# Patient Record
Sex: Female | Born: 1960 | Race: White | Hispanic: No | Smoking: Former smoker
Health system: Southern US, Community
[De-identification: ages and names within clinical notes are randomized; demographics above are authoritative.]

## PROBLEM LIST (undated history)

## (undated) DIAGNOSIS — L814 Other melanin hyperpigmentation: Secondary | ICD-10-CM

## (undated) DIAGNOSIS — I10 Essential (primary) hypertension: Secondary | ICD-10-CM

## (undated) DIAGNOSIS — K648 Other hemorrhoids: Secondary | ICD-10-CM

## (undated) DIAGNOSIS — N200 Calculus of kidney: Secondary | ICD-10-CM

## (undated) HISTORY — DX: Other melanin hyperpigmentation: L81.4

## (undated) HISTORY — DX: Calculus of kidney: N20.0

## (undated) HISTORY — DX: Essential (primary) hypertension: I10

## (undated) HISTORY — DX: Other hemorrhoids: K64.8

---

## 1997-10-11 ENCOUNTER — Other Ambulatory Visit: Admission: RE | Admit: 1997-10-11 | Discharge: 1997-10-11 | Payer: Self-pay | Admitting: Gynecology

## 2000-12-15 ENCOUNTER — Other Ambulatory Visit: Admission: RE | Admit: 2000-12-15 | Discharge: 2000-12-15 | Payer: Self-pay | Admitting: Gynecology

## 2002-08-12 ENCOUNTER — Other Ambulatory Visit: Admission: RE | Admit: 2002-08-12 | Discharge: 2002-08-12 | Payer: Self-pay | Admitting: Obstetrics and Gynecology

## 2002-08-17 ENCOUNTER — Encounter: Payer: Self-pay | Admitting: Obstetrics and Gynecology

## 2002-08-17 ENCOUNTER — Ambulatory Visit (HOSPITAL_COMMUNITY): Admission: RE | Admit: 2002-08-17 | Discharge: 2002-08-17 | Payer: Self-pay | Admitting: Obstetrics and Gynecology

## 2002-09-27 ENCOUNTER — Encounter: Payer: Self-pay | Admitting: Obstetrics and Gynecology

## 2002-09-27 ENCOUNTER — Encounter (INDEPENDENT_AMBULATORY_CARE_PROVIDER_SITE_OTHER): Payer: Self-pay | Admitting: Specialist

## 2002-09-27 ENCOUNTER — Ambulatory Visit (HOSPITAL_COMMUNITY): Admission: RE | Admit: 2002-09-27 | Discharge: 2002-09-27 | Payer: Self-pay | Admitting: Obstetrics and Gynecology

## 2003-10-05 ENCOUNTER — Other Ambulatory Visit: Admission: RE | Admit: 2003-10-05 | Discharge: 2003-10-05 | Payer: Self-pay | Admitting: Obstetrics and Gynecology

## 2004-02-12 HISTORY — PX: RIGHT OOPHORECTOMY: SHX2359

## 2004-10-23 ENCOUNTER — Other Ambulatory Visit: Admission: RE | Admit: 2004-10-23 | Discharge: 2004-10-23 | Payer: Self-pay | Admitting: Obstetrics and Gynecology

## 2005-06-25 ENCOUNTER — Emergency Department (HOSPITAL_COMMUNITY): Admission: EM | Admit: 2005-06-25 | Discharge: 2005-06-25 | Payer: Self-pay | Admitting: Emergency Medicine

## 2005-11-28 ENCOUNTER — Other Ambulatory Visit: Admission: RE | Admit: 2005-11-28 | Discharge: 2005-11-28 | Payer: Self-pay | Admitting: Obstetrics and Gynecology

## 2006-10-04 IMAGING — CT CT PELVIS W/O CM
1 series · 14 of 32 positions shown, 18 images · IV contrast (agent unspecified)
Comparison: None.

CLINICAL DATA: Right lower quadrant and flank pain for three days.  Question kidney stone.  
ABDOMEN CT WITHOUT CONTRAST:
TECHNIQUE: Multidetector CT imaging of the abdomen was performed following the standard protocol without IV contrast.
TECHNIQUE: Multidetector CT imaging of the pelvis was performed following the standard protocol without IV contrast.

[Series 2: kidney sto 5.0 b30f · axial · 0.63mm/px · z∈[-400,-56]mm · 14 of 97 slices shown, 18 images]
[im 7/97  soft-tissue]
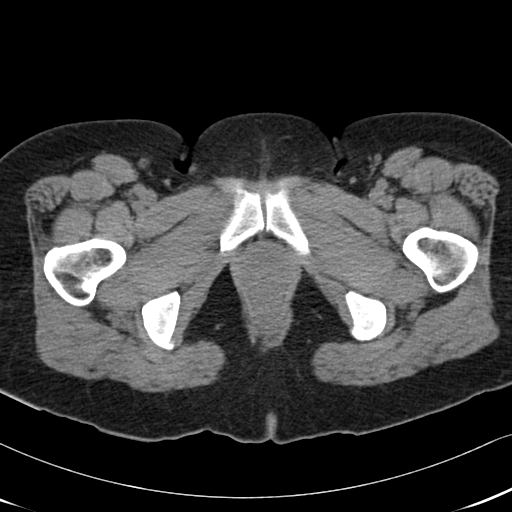
[im 7/97  bone]
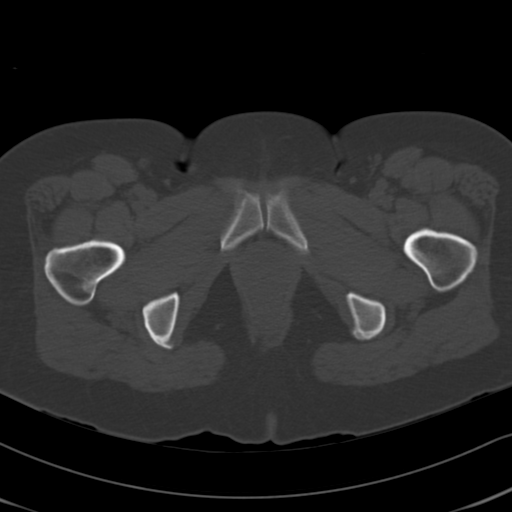
[im 13/97  soft-tissue]
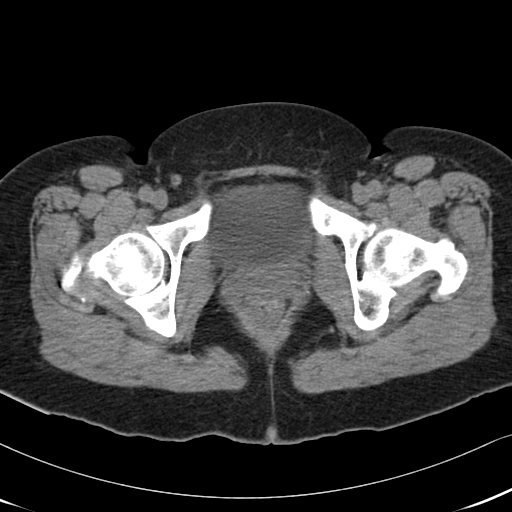
[im 22/97  soft-tissue]
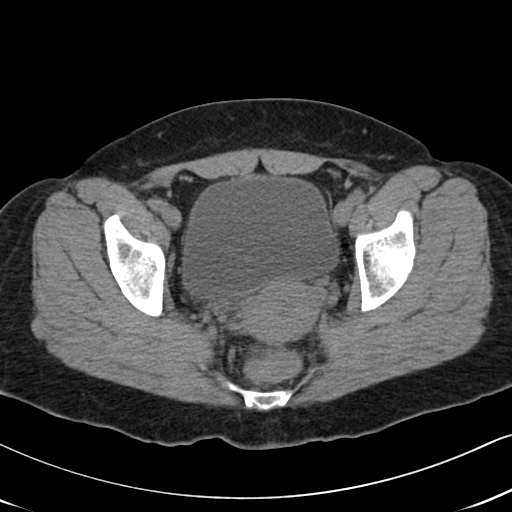
[im 28/97  soft-tissue]
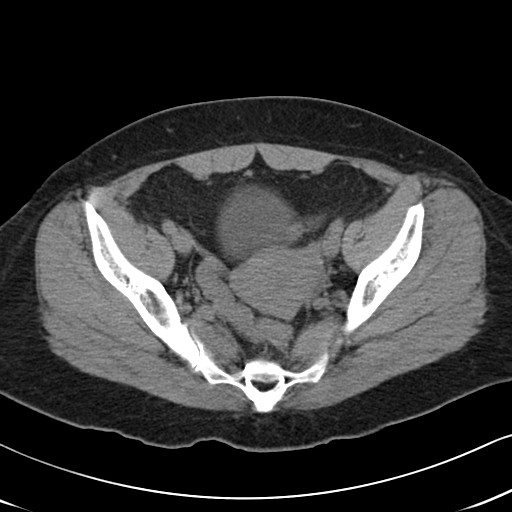
[im 38/97  soft-tissue]
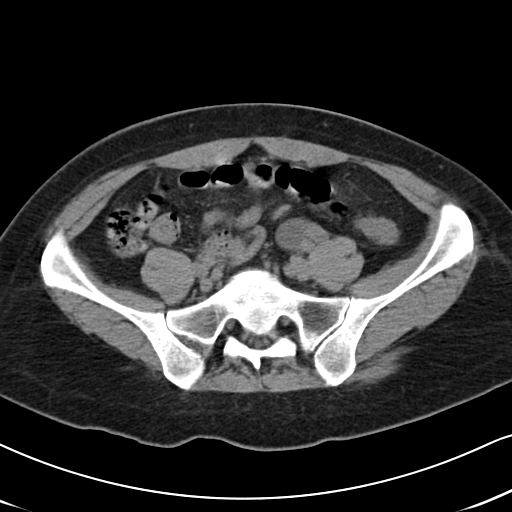
[im 44/97  soft-tissue]
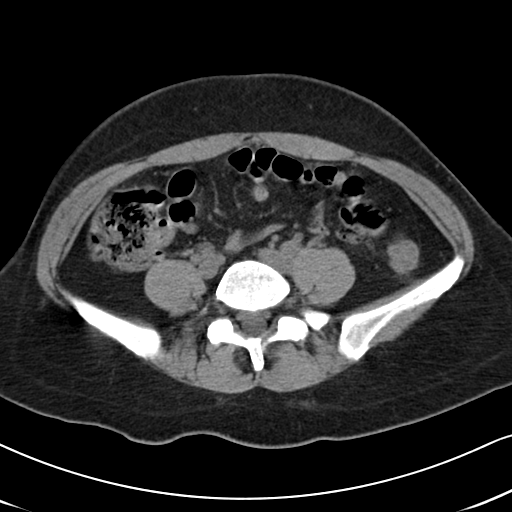
[im 53/97  soft-tissue]
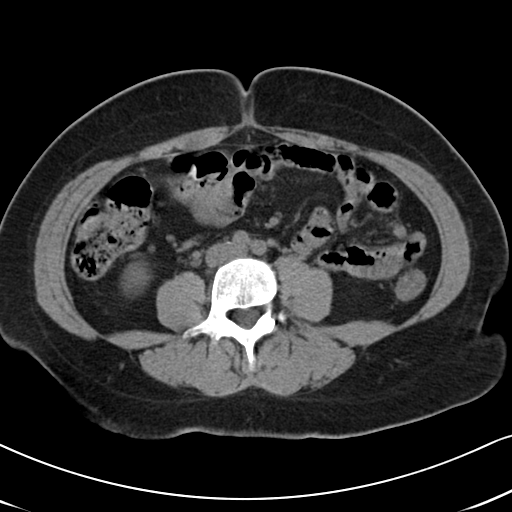
[im 59/97  soft-tissue]
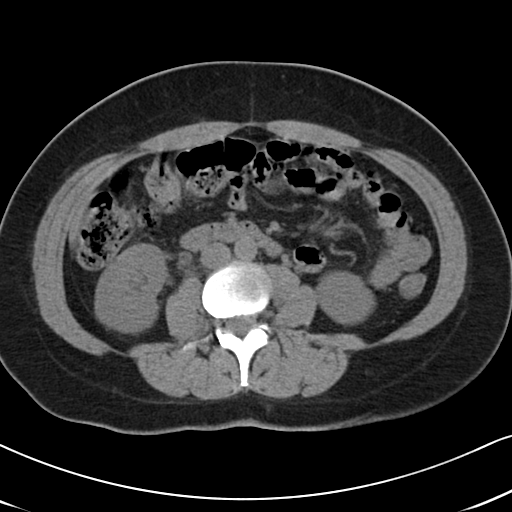
[im 69/97  soft-tissue]
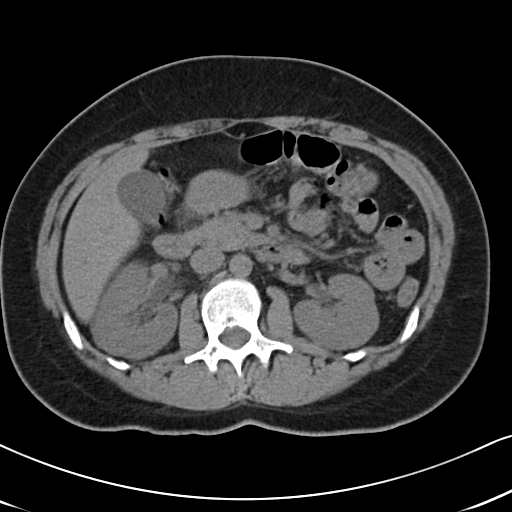
[im 69/97  bone]
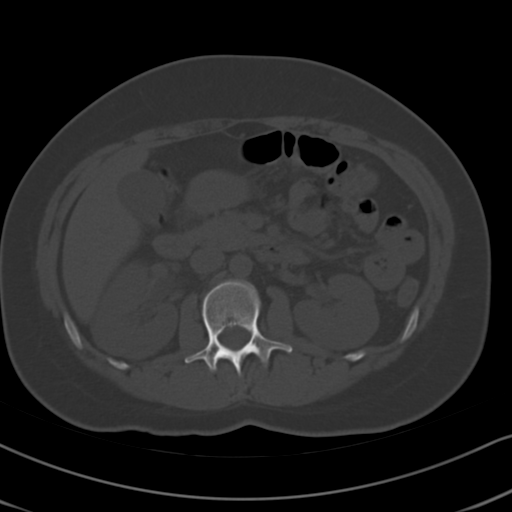
[im 75/97  soft-tissue]
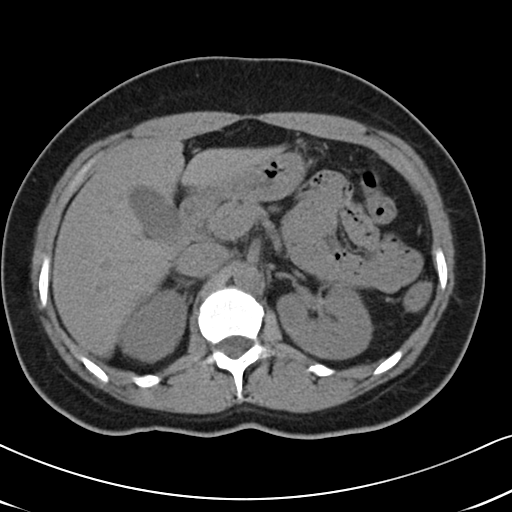
[im 84/97  soft-tissue]
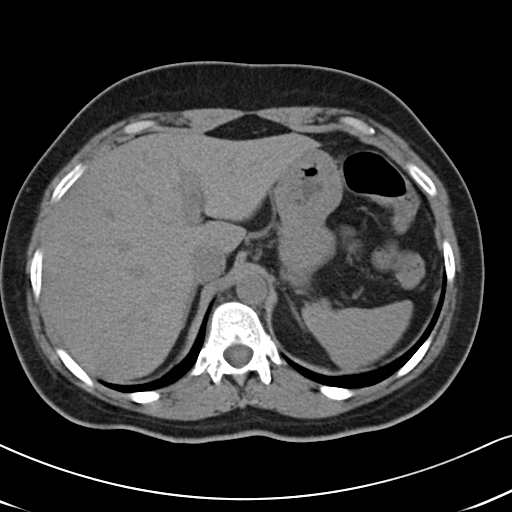
[im 84/97  lung]
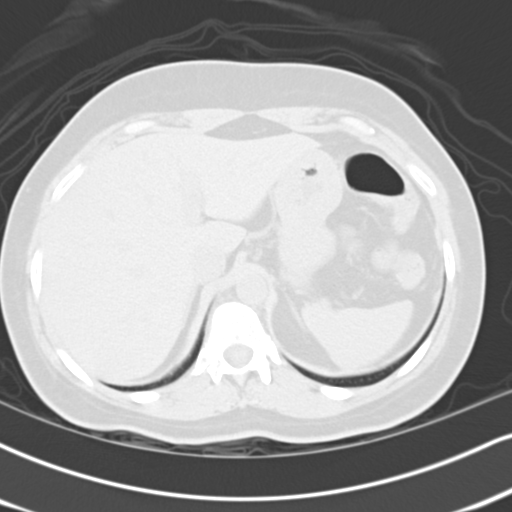
[im 87/97  lung]
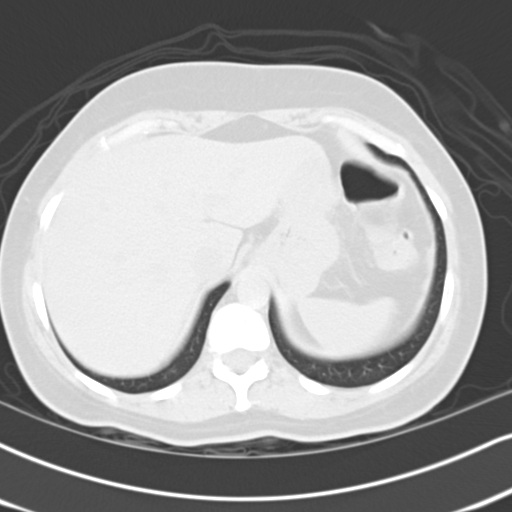
[im 90/97  soft-tissue]
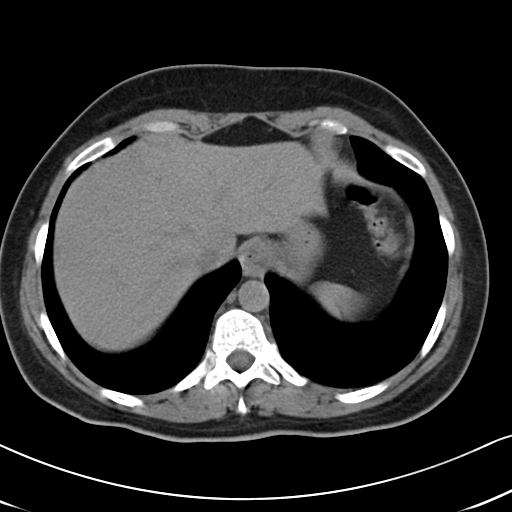
[im 90/97  lung]
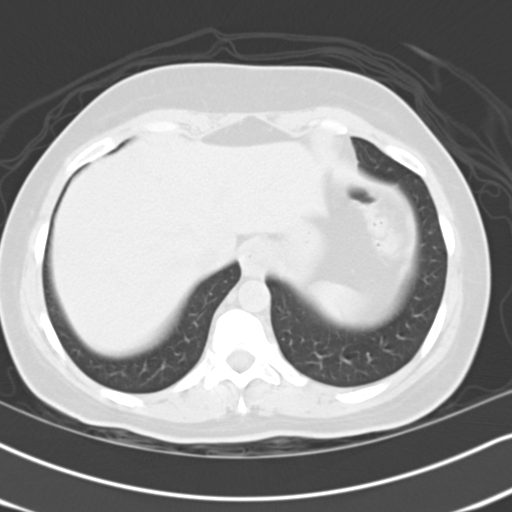
[im 93/97  lung]
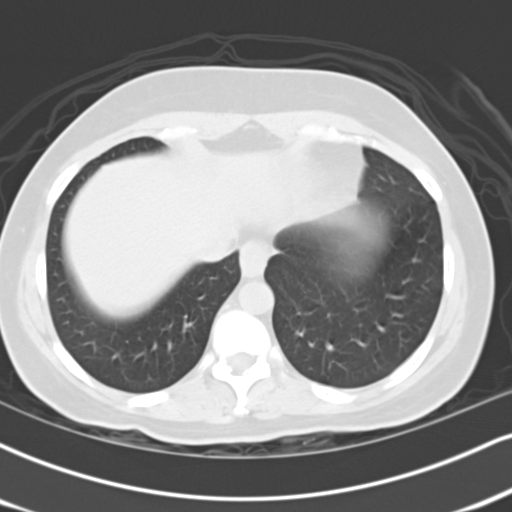

[14 of 32 positions shown; findings below may reference images not displayed]

FINDINGS: The lung bases are clear.  The heart size is normal.  There is no pericardial or pleural effusion.  Too small to characterize left hepatic dome lesion on image 6, which likely represents a tiny cyst.  
The uninfused appearance of the spleen is normal.  The distal esophagus is borderline thick walled, suggestive of mild esophagitis. 
The stomach, pancreas, gallbladder, and adrenal glands are normal. 
The left kidney is normal without evidence of calculus or hydronephrosis.
There are no right renal calculi.  There are mild proximal signs of right perirenal edema and hydroureter.  These are secondary to probable calculus, to be described below.  There is no retroperitoneal or retrocrural adenopathy.  The appendix is seen on image 63 and is unremarkable.  The colon is otherwise normal.  Small bowel is normal, and there is no ascites.
IMPRESSION: 1.  Mild right-sided secondary signs of right perirenal edema and hydroureter due to a probable stone described below. 
2.  Possible distal esophageal wall thickening.  Correlate with any symptoms of esophagitis. 
PELVIS CT WITHOUT CONTRAST:
FINDINGS: Pelvic large and small bowel is normal.  The right ureter is prominent throughout its course.  It is difficult to follow distally.  There is a punctate calcific density suspicious for distal ureteric calculus on image 74.  This measures approximately 1 mm.  More inferiorly, there are calcifications on images 78 and 79 within the bilateral hemipelvis.  These are felt to be phlebolithic in nature.  There are no urinary bladder calculi.  A sub millimetric calcific density on image 93 in the region of the urethra is of questionable clinical significance. 
The left ovary is within the upper limits of normal for size.  The uterus and right adnexa are normal.  
Bone windows demonstrate no worrisome osseous lesions.
IMPRESSION: 1.  Mild right-sided hydroureter, likely secondary to a punctate distal ureteric calculus.  The distal ureter is difficult to follow, and this calcification could less likely be vascular in nature. 
2.  Calcific density in the region of the urethra measures less than 1 mm and is of questionable clinical significance.

## 2007-03-02 ENCOUNTER — Encounter: Admission: RE | Admit: 2007-03-02 | Discharge: 2007-03-02 | Payer: Self-pay | Admitting: Obstetrics and Gynecology

## 2008-10-24 ENCOUNTER — Encounter: Admission: RE | Admit: 2008-10-24 | Discharge: 2008-10-24 | Payer: Self-pay | Admitting: Obstetrics and Gynecology

## 2009-05-01 ENCOUNTER — Ambulatory Visit: Payer: Self-pay | Admitting: Family Medicine

## 2009-12-12 ENCOUNTER — Encounter: Admission: RE | Admit: 2009-12-12 | Discharge: 2009-12-12 | Payer: Self-pay | Admitting: Obstetrics and Gynecology

## 2010-06-29 NOTE — Op Note (Signed)
NAMEAYME, SHORT NO.:  [id number]   MEDICAL RECORD NO.:  [id number]                   PATIENT TYPE:  AMB   LOCATION:  DAY                                  FACILITY:  [facility]   PHYSICIAN:  Huel Cote, M.D.              DATE OF BIRTH:  1960-09-18   DATE OF PROCEDURE:  09/27/2002  DATE OF DISCHARGE:                                 OPERATIVE REPORT   PREOPERATIVE DIAGNOSES:  1. Complex right ovarian cyst.  2. Elevated CA125.  3. History of fertility treatments and ovarian stimulation.   POSTOPERATIVE DIAGNOSES:  1. Complex right ovarian cyst, with frozen pathology revealing a benign     corpus luteum hemorrhagic cyst with a thickened septum.  2. Elevated CA125.  3. History of fertility treatments and ovarian stimulation.   PROCEDURE:  Laparoscopic right salpingo-oophorectomy.   SURGEON:  Huel Cote, M.D.   ASSISTANT:  Malachi Pro. Ambrose Mantle, M.D.   ANESTHESIA:  General.   ESTIMATED BLOOD LOSS:  Minimal.   FLUIDS REPLACED:  1200 mL LR.   URINE OUTPUT:  150 mL clear urine.   FINDINGS:  There was a left ovary and tube, which were noted to be within  normal limits.  The right ovary was slightly enlarged, however had no  excrescences or abnormalities.  Frozen pathology was benign.  All other  anatomy in the pelvis and abdomen was normal.   DESCRIPTION OF PROCEDURE:  The patient was taken to the operating room,  where general anesthesia was obtained without difficulty.  She was then  prepped and draped in the normal sterile fashion in the dorsal lithotomy  position.  A speculum was placed within the vagina, the cervix identified  and grasped with a single-tooth tenaculum and a Hulka tenaculum placed  within the cervical os for uterine manipulation.  The speculum was then  removed, Foley catheter placed, and attention was then turned to the  patient's umbilicus, which was grasped with Allis clamps and elevated  inferiorly.  A small 2  cm incision was then made with a scalpel and carried  down to the fascia with the Allis used to elevate the anterior abdominal  wall.  The fascia was then opened sharply and the fascia incision extended  with Mayo scissors laterally.  Vicryl 0 was then placed in the fascial edge  and the peritoneal cavity was palpated digitally.  This was then entered  bluntly by digital probe.  With the peritoneal cavity entered, the fascia  was completely pursestringed with 0 Vicryl and the Hasson placed within the  peritoneal cavity.  The Hasson was secured with a suture and the camera  introduced with the pelvis and abdomen inspected with findings as previously  stated.  The inferior quadrants were then inspected and two additional 5 mm  disposable trocars were placed under direct visualization.  All trocar sites  were injected with 0.25% Marcaine prior to incision.  The ovary and tube  were then grasped with an atraumatic grasper and peeled away from the  uterine fundus.  The utero-ovarian ligament was then taken down with the  bipolar cutting cautery unit and then the infundibulopelvic ligament was  taken down in a similar fashion with the bipolar cutting cautery unit.  With  the ovary completely amputated and the tube with it, this was held in an  atraumatic grasper.  No active bleeding on any pedicles was noted.  Therefore, the Endo-Bag was introduced into the 12 mm port and the ovary  placed within it and removed and sent for frozen pathology.  One small area  of colon on the right which was close to the IP pedicle was pushed away with  Kitner device and found to be well away from the pedicle cautery area.  Once  again the abdomen and pelvis were carefully inspected with no active  bleeding noted and attention was then returned to the outer quadrants, where  each 5 mm disposable trocar was removed.  The Hasson was removed and the  pneumoperitoneum evacuated.  The fascial incision was then cinched  down from  its original pursestring suture and no defect was palpable.  One additional  figure-of-eight suture of 0 Vicryl was placed with a good closure noted.  The skin was then closed with 3-0 Vicryl in a subcuticular stitch.  Sponge,  lap, and needle counts were correct x2.  The Hulka tenaculum and the Foley  were removed, and the patient was awakened and taken to the recovery room  extubated and in stable condition.                                               Huel Cote, M.D.    KR/MEDQ  D:  09/27/2002  T:  09/27/2002  Job:  [id number]

## 2010-06-29 NOTE — H&P (Signed)
NAMEJANAYSIA, Alexis Lane NO.:  [id number]   MEDICAL RECORD NO.:  [id number]                   PATIENT TYPE:  AMB   LOCATION:  DAY                                  FACILITY:  [facility]   PHYSICIAN:  Huel Cote, M.D.              DATE OF BIRTH:  Dec 28, 1960   DATE OF ADMISSION:  DATE OF DISCHARGE:                                HISTORY & PHYSICAL   HISTORY OF PRESENT ILLNESS:  The patient is a 50 year old G1, P0, who is  admitted for a laparoscopic right salpingo-oophorectomy, possible left  salpingo-oophorectomy, given a finding of a borderline elevated CA-125 level  and an ultrasound demonstrating a complex right ovarian cyst with a mural  nodularity and septation.  The patient had a CA-125 level performed by her  primary M.D., which was 41.9.  A repeat of this level in July of 2004  decreased to 24.5, which was reassuring.  The patient had a history of  taking many fertility drugs in an effort to conceive, and for that reason,  is more concerned regarding possible ovarian cancer.  She had an ultrasound  performed on August 17, 2002, which revealed a complex 2 cm right ovarian cyst  with a mural nodularity and septation and desires definitive surgical  diagnosis and removal of this ovary.   PAST MEDICAL HISTORY:  1. Polycystic ovary disease.  2. Chronic hypertension.   PAST SURGICAL HISTORY:  In 1994, she had an ovarian wedge resection  laparoscopically.   MEDICATIONS:  1. Monopril.  2. Hydrochlorothiazide one p.o. daily.  3. Mega-3 fatty acids.   PAST GYNECOLOGICAL HISTORY:  The patient had a history of infertility and a  trial of in vitro fertilization which was not successful.   ALLERGIES:  None.   CURRENT MENSTRUAL HISTORY:  The patient had irregular cycles until reaching  approximately 50 years old and then began to have menses q.35d.   FAMILY HISTORY:  Significant for breast cancer in paternal grandmother and  in several great aunts,  all in their 65s.  Colon cancer:  None.  Her mother  had an MI at the age of 55.  She had a mammogram performed in April of 2004  which was within normal limits.  Cholesterol was normal.   PAST OBSTETRICAL HISTORY:  Significant for one spontaneous abortion in 1993.   PHYSICAL EXAMINATION:  VITAL SIGNS:  Weight is 146 pounds, blood pressure  112/76.  CARDIAC:  Regular rate and rhythm.  LUNGS:  Clear.  ABDOMEN:  Soft and nontender.  BREASTS:  Breasts have no masses, discharge, or adenopathy noted.  GENITALIA:  Her external genitalia are normal.  Speculum exam shows no  lesions on the cervix.  Uterus is normal in size and adnexa had no masses.  RECTAL:  Exam is Heme-negative.   The patient was counseled as to the risks and benefits of the procedure  including bleeding, infection, and possible  damage to bowel and bladder.  The patient understands these risks and desires to proceed with surgery as  stated.  She understands this will be an open laparoscopic procedure;  however, should any unusual bleeding or bowel or bladder injury occur, she  would need a larger incision to correct this problem at the time.  A frozen  specimen will be sent for diagnosis intraoperatively; however, the patient  wishes to be awakened and consulted before any further surgical therapy was  planned, should any carcinoma be present in the ovary.                                                    Huel Cote, M.D.    KR/MEDQ  D:  09/23/2002  T:  09/23/2002  Job:  [id number]

## 2010-08-01 ENCOUNTER — Inpatient Hospital Stay (INDEPENDENT_AMBULATORY_CARE_PROVIDER_SITE_OTHER)
Admission: RE | Admit: 2010-08-01 | Discharge: 2010-08-01 | Disposition: A | Discharge status: Home / Self Care | Payer: 59 | Source: Ambulatory Visit | Attending: Emergency Medicine | Admitting: Emergency Medicine

## 2010-08-01 ENCOUNTER — Ambulatory Visit
Admission: RE | Admit: 2010-08-01 | Discharge: 2010-08-01 | Disposition: A | Discharge status: Home / Self Care | Payer: 59 | Source: Ambulatory Visit | Attending: Emergency Medicine | Admitting: Emergency Medicine

## 2010-08-01 ENCOUNTER — Other Ambulatory Visit: Payer: Self-pay | Admitting: Emergency Medicine

## 2010-08-01 ENCOUNTER — Encounter: Payer: Self-pay | Admitting: Emergency Medicine

## 2010-08-01 DIAGNOSIS — M542 Cervicalgia: Secondary | ICD-10-CM

## 2010-08-01 DIAGNOSIS — I10 Essential (primary) hypertension: Secondary | ICD-10-CM | POA: Insufficient documentation

## 2010-08-05 ENCOUNTER — Telehealth (INDEPENDENT_AMBULATORY_CARE_PROVIDER_SITE_OTHER): Payer: Self-pay | Admitting: Emergency Medicine

## 2010-08-20 ENCOUNTER — Encounter: Payer: Self-pay | Admitting: Internal Medicine

## 2010-08-27 ENCOUNTER — Other Ambulatory Visit: Payer: Self-pay | Admitting: Family Medicine

## 2010-08-27 DIAGNOSIS — R102 Pelvic and perineal pain: Secondary | ICD-10-CM

## 2010-08-29 ENCOUNTER — Ambulatory Visit
Admission: RE | Admit: 2010-08-29 | Discharge: 2010-08-29 | Disposition: A | Discharge status: Home / Self Care | Payer: 59 | Source: Ambulatory Visit | Attending: Family Medicine | Admitting: Family Medicine

## 2010-08-29 DIAGNOSIS — R102 Pelvic and perineal pain: Secondary | ICD-10-CM

## 2010-09-07 ENCOUNTER — Ambulatory Visit (AMBULATORY_SURGERY_CENTER): Payer: Commercial Managed Care - PPO | Admitting: *Deleted

## 2010-09-07 VITALS — Ht 61.0 in | Wt 136.3 lb

## 2010-09-07 DIAGNOSIS — Z1211 Encounter for screening for malignant neoplasm of colon: Secondary | ICD-10-CM

## 2010-09-07 MED ORDER — PEG-KCL-NACL-NASULF-NA ASC-C 100 G PO SOLR: ORAL | Status: DC

## 2010-09-21 ENCOUNTER — Encounter: Payer: Self-pay | Admitting: Internal Medicine

## 2010-09-21 ENCOUNTER — Ambulatory Visit (AMBULATORY_SURGERY_CENTER): Payer: Commercial Managed Care - PPO | Admitting: Internal Medicine

## 2010-09-21 VITALS — BP 101/67 | HR 98 | Temp 98.1°F | Resp 17 | Ht 61.0 in | Wt 132.0 lb

## 2010-09-21 DIAGNOSIS — Z1211 Encounter for screening for malignant neoplasm of colon: Secondary | ICD-10-CM

## 2010-09-21 MED ORDER — SODIUM CHLORIDE 0.9 % IV SOLN: 500.0000 mL | INTRAVENOUS | Status: DC

## 2010-09-24 ENCOUNTER — Telehealth: Payer: Self-pay | Admitting: *Deleted

## 2010-09-24 NOTE — Telephone Encounter (Signed)
Message left to call us if need be.

## 2010-11-01 ENCOUNTER — Encounter: Payer: Self-pay | Admitting: Family Medicine

## 2010-11-01 ENCOUNTER — Inpatient Hospital Stay (INDEPENDENT_AMBULATORY_CARE_PROVIDER_SITE_OTHER)
Admission: RE | Admit: 2010-11-01 | Discharge: 2010-11-01 | Disposition: A | Discharge status: Home / Self Care | Payer: 59 | Source: Ambulatory Visit | Attending: Family Medicine | Admitting: Family Medicine

## 2010-11-01 DIAGNOSIS — M542 Cervicalgia: Secondary | ICD-10-CM

## 2010-11-01 DIAGNOSIS — M7710 Lateral epicondylitis, unspecified elbow: Secondary | ICD-10-CM

## 2010-12-18 ENCOUNTER — Ambulatory Visit (INDEPENDENT_AMBULATORY_CARE_PROVIDER_SITE_OTHER): Payer: 59 | Admitting: Family Medicine

## 2010-12-18 ENCOUNTER — Encounter: Payer: Self-pay | Admitting: Family Medicine

## 2010-12-18 VITALS — BP 122/82 | HR 93 | Temp 98.4°F | Ht 61.0 in | Wt 130.0 lb

## 2010-12-18 DIAGNOSIS — M542 Cervicalgia: Secondary | ICD-10-CM

## 2010-12-18 MED ORDER — TRAMADOL HCL 50 MG PO TABS: 50.0000 mg | ORAL_TABLET | Freq: Three times a day (TID) | ORAL | Status: DC | PRN

## 2010-12-18 MED ORDER — MELOXICAM 15 MG PO TABS: 15.0000 mg | ORAL_TABLET | Freq: Every day | ORAL | Status: AC

## 2010-12-18 NOTE — Patient Instructions (Signed)
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 Meloxicam 15mg  daily with food for pain and inflammation x 7 days then as needed.  Tramadol as needed for severe pain (no driving on this medicine). Consider cervical collar if severely painful - I do not think you need this. Simple range of motion exercises within limits of pain to prevent further stiffness. Consider physical therapy for stretching, exercises, and modalities. Acupuncture, chiropractor, and massage are considerations as well but results are mixed with these. Heat 15 minutes at a time 3-4 times a day to help with spasms. Watch head position when on computers, texting, when sleeping in bed - neck should in line with back when sleeping. Call me in a few weeks if you want to go ahead with physical therapy and are not improving as expected with the above and acupuncture.

## 2010-12-18 NOTE — Progress Notes (Signed)
Subjective:    Patient ID: Alexis Lane, female    DOB: 05-06-1960, 50 y.o.   MRN: [id number]  PCP: Judge Stall  HPI 50 yo F here for neck pain.  Patient reports several months ago she recalls having woken up with pain in posterior neck. Went to ED and had radiographs done showing DDD at C5-6 and C6-7 levels. She was prescribed tramadol then which helped. She also went on to have acupuncture which also helped to resolve this over time. Very active with yoga and gets regular massages. Then approximately 2 weeks ago she developed similar pain in posterior neck more on left side that has persisted. Believes this is likely related to increased stress from work. Feels more with neck flexion and left lateral rotation. Has tried stretching, ibuprofen, heat and ice. No numbness or tingling. No bowel or bladder dysfunction. Some radiation into left > right arms but nothing consistent.  Past Medical History  Diagnosis Date  . Hypertension   . Kidney stones     Current Outpatient Prescriptions on File Prior to Visit  Medication Sig Dispense Refill  . Cholecalciferol (VITAMIN D) 1000 UNITS capsule Take 1,000 Units by mouth daily.        . Coenzyme Q10 (CO Q-10) 100 MG CAPS Take 1 tablet by mouth daily.        . Magnesium 500 MG CAPS Take 1 capsule by mouth daily.        . Multiple Vitamin (MULTIVITAMIN) tablet Take 1 tablet by mouth daily.        Marland Kitchen olmesartan-hydrochlorothiazide (BENICAR HCT) 20-12.5 MG per tablet Take 1 tablet by mouth at bedtime.        . Omega-3 Fatty Acids (FISH OIL) 1000 MG CAPS Take 1 capsule by mouth daily.        Marland Kitchen OVER THE COUNTER MEDICATION Apply 20 mg topically daily. PRO-GEST TOPICAL CREAM         Past Surgical History  Procedure Date  . Right oophorectomy 2006    No Known Allergies  History   Social History  . Marital Status: Divorced    Spouse Name: N/A    Number of Children: N/A  . Years of Education: N/A   Occupational History  . Not on  file.   Social History Main Topics  . Smoking status: Former Games developer  . Smokeless tobacco: Never Used  . Alcohol Use: No  . Drug Use: No  . Sexually Active: Not on file   Other Topics Concern  . Not on file   Social History Narrative  . No narrative on file    Family History  Problem Relation Age of Onset  . Lung cancer Mother 19  . Heart attack Mother   . Hypertension Mother   . Colon cancer Neg Hx   . Diabetes Neg Hx   . Hyperlipidemia Neg Hx   . Sudden death Neg Hx   . Heart attack Father   . Hypertension Maternal Aunt   . Hypertension Maternal Grandmother   . Heart attack Maternal Grandfather     BP 122/82  Pulse 93  Temp(Src) 98.4 F (36.9 C) (Oral)  Ht 5\' 1"  (1.549 m)  Wt 130 lb (58.968 kg)  BMI 24.56 kg/m2  Review of Systems See HPI above.    Objective:   Physical Exam Neck: No gross deformity, swelling, bruising. TTP left cervical paraspinal region.  No midline/bony TTP. FROM neck - pain on left lateral rotation and full flexion. BUE strength 5/5.  Sensation intact to light touch.  2+ equal reflexes in triceps, biceps, brachioradialis tendons. Negative spurlings. NV intact distal BUEs.    Assessment & Plan:  1. Neck pain - 2/2 DDD and cervical spasms - Discussed conservative care - she would like to wait on physical therapy and again try acupuncture as this has helped in the past.  Start meloxicam + tramadol as needed.  Did not tolerate muscle relaxant well in the past and she would like to avoid this.  Advised to call us if she would like to proceed with PT.  Do not think based on her exam and radiographs that she has radiculopathy.  See instructions for further.

## 2010-12-19 DIAGNOSIS — M542 Cervicalgia: Secondary | ICD-10-CM | POA: Insufficient documentation

## 2010-12-19 NOTE — Assessment & Plan Note (Signed)
2/2 DDD and cervical spasms - Discussed conservative care - she would like to wait on physical therapy and again try acupuncture as this has helped in the past.  Start meloxicam + tramadol as needed.  Did not tolerate muscle relaxant well in the past and she would like to avoid this.  Advised to call us if she would like to proceed with PT.  Do not think based on her exam and radiographs that she has radiculopathy.  See instructions for further.

## 2011-01-14 NOTE — Telephone Encounter (Signed)
  Phone Note Outgoing Call   Call placed by: Lavell Islam RN,  August 05, 2010 12:54 PM Call placed to: Patient Action Taken: Phone Call Completed Summary of Call: Left message on voice mail inquiring about patient's neck pain status; encouraged her to call us with any questions/concerns. Initial call taken by: Lavell Islam RN,  August 05, 2010 12:55 PM

## 2011-01-14 NOTE — Progress Notes (Signed)
Summary: neck pain (rm 5)   Vital Signs:  Patient Profile:   50 Years Old Female CC:      neck pain x 10 days Height:     61 inches Weight:      130 pounds O2 Sat:      100 % O2 treatment:    Room Air Temp:     99.1 degrees F oral Pulse rate:   101 / minute Resp:     16 per minute BP sitting:   152 / 87  (left arm) Cuff size:   regular  Pt. in pain?   yes    Location:   neck    Intensity:   8    Type:       dull  Vitals Entered By: Lajean Saver RN (August 01, 2010 12:26 PM)                   Updated Prior Medication List: BENICAR HCT 20-12.5 MG TABS (OLMESARTAN MEDOXOMIL-HCTZ) once daily  Current Allergies: No known allergies History of Present Illness History from: patient Chief Complaint: neck pain x 10 days History of Present Illness: Pt complains of neck pain. Location:posterior neck/cervical area diffusely Onset: 10 days Description/Quality of Pain:dull ache Intensity of pain: 6/10 at times. Sometimes 8/10. Otilio Carpen feels a "click" when she turns her head. She states that over the past 10 years, she may get this neck pain, which resolves after 1-2 weeks with conservative tx or going to chiropractor. She states that plain neck xrays approx 2006 (done elsewhere)  were negative. Modifying Factors: Tried Ibuprofen 800 three times a day for 1 wk, minimal help Trauma: None recalled. Symptoms Worse with: flex, extend, turn head Better with: rest Associated ssxs: pain radiates to post occipital area and to shoulders, but not beyond. Denies numbness, weakness, extremity sxs, chest pain ,dyspnea, or anterior neck pain. No ENT sxs or fever.   REVIEW OF SYSTEMS Constitutional Symptoms      Denies fever, chills, night sweats, weight loss, weight gain, and fatigue.  Eyes       Denies change in vision, eye pain, eye discharge, glasses, contact lenses, and eye surgery. Ear/Nose/Throat/Mouth       Denies hearing loss/aids, change in hearing, ear pain, ear discharge,  dizziness, frequent runny nose, frequent nose bleeds, sinus problems, sore throat, hoarseness, and tooth pain or bleeding.  Respiratory       Denies dry cough, productive cough, wheezing, shortness of breath, asthma, bronchitis, and emphysema/COPD.  Cardiovascular       Denies murmurs, chest pain, and tires easily with exhertion.    Gastrointestinal       Denies stomach pain, nausea/vomiting, diarrhea, constipation, blood in bowel movements, and indigestion. Genitourniary       Denies painful urination, kidney stones, and loss of urinary control. Neurological       Complains of headaches.      Denies paralysis, seizures, and fainting/blackouts. Musculoskeletal       Complains of muscle pain, joint pain, and decreased range of motion.      Denies joint stiffness, redness, swelling, muscle weakness, and gout.      Comments: neck Skin       Denies bruising, unusual mles/lumps or sores, and hair/skin or nail changes.  Psych       Denies mood changes, temper/anger issues, anxiety/stress, speech problems, depression, and sleep problems. Other Comments: Patient c/o neck pain x 10 days. Unknown cause. Pain is spreading into posterior head. She  has used ibuprofen and tylenol for pain relief. Denies any other symptoms   Past History:  Past Medical History: Hypertension  Past Surgical History: oophorectomy 2005  Family History: CVD- both parents  Social History: Never Smoked Alcohol use-no Drug use-no Smoking Status:  never Drug Use:  no Physical Exam General appearance: well developed, well nourished, no acute distress Head: normocephalic, atraumatic. Minimal bilat post-occipital ttp Eyes: conjunctivae and lids normal Pupils: equal, round, reactive to light Oral/Pharynx: tongue normal, posterior pharynx without erythema or exudate Neck: neck supple,  trachea midline, no masses. Carotids 2 + and = without bruits or tenderness. Thyroid: no nodules, masses, tenderness, or  enlargement Chest/Lungs: no rales, wheezes, or rhonchi bilateral, breath sounds equal without effort Heart: regular rate and  rhythm, no murmur Extremities: normal extremities Neurological: grossly intact and non-focal. Motor, sensory, DTR's 2 and = bilat upper and lower extremities Skin: no obvious rashes or lesions MSE: Post neck:No deformity.  Moderate diffuse ttp over entire post C-spine and paracervical mms bilaterally. Moderate spasm and ttp over bilat post paracervical mms. ROM to flexion, extension, lateral bending intact, but moderate pain and minimal crepitus upon these motions. Assessment New Problems: NECK PAIN, ACUTE (ICD-723.1) HYPERTENSION (ICD-401.9)  Most likely has musculo-ligamentous neck strain. No evidence of neurological deficit or impingement. XRAY C-Spine: No fx or acute bony abnomality."Straightening of cervical lordosis. Cervicothoracic junction alignment is within normal limits.  Normal prevertebral soft tissues.Mild to moderate C6-C7 disc space narrowing with endplate osteophytes.  Mild C5-C6 disc space narrowing and osteophytes. Mild osseous foraminal stenosis on the right at the C5 nerve level. Bilateral posterior element alignment is within normal limits.   C1,C2 alignment, AP alignment, odontoid, and lung apices within normal limits."  Patient Education: Patient and/or caregiver instructed in the following: Tylenol prn, Ibuprofen prn. Heat/ ice packs for sxs relief. Demonstrates willingness to comply.  Plan New Medications/Changes: TRAMADOL HCL 50 MG TABS (TRAMADOL HCL) 1 q 6-8 hrs as needed pain  #20 x 0, 08/01/2010, Lajean Manes MD  New Orders: T-DG Cervical Spine Complete 5 views [72050] New Patient Level III [99203] Planning Comments:   w/u and tx options discussed. I explained that I doubt her pain is discogenic. She declined any further imaging or referral. She prefers to use tramadol, which helped acute pain in the distant past. She pefers to  see her accupucturist for pain tx. Risks, benefits, alternatives discussed. Pt voiced understanding and agreement. --Advised to f/u if any worsening, new, or persistant sxs.  Follow Up: Follow up with Primary Physician Follow Up: PCP for HBP management.  The patient and/or caregiver has been counseled thoroughly with regard to medications prescribed including dosage, schedule, interactions, rationale for use, and possible side effects and they verbalize understanding.  Diagnoses and expected course of recovery discussed and will return if not improved as expected or if the condition worsens. Patient and/or caregiver verbalized understanding.  Prescriptions: TRAMADOL HCL 50 MG TABS (TRAMADOL HCL) 1 q 6-8 hrs as needed pain  #20 x 0   Entered and Authorized by:   Lajean Manes MD   Signed by:   Lajean Manes MD on 08/01/2010   Method used:   Handwritten   RxID:   [id number]   Orders Added: 1)  T-DG Cervical Spine Complete 5 views [72050] 2)  New Patient Level III [78469]  Appended Document: neck pain (rm 5) At time of visit, pt declined a cervical collar.

## 2011-01-14 NOTE — Progress Notes (Signed)
Summary: neck,arm pain...wse (rm 3)   Vital Signs:  Patient Profile:   50 Years Old Female CC:      worsening neck, elbow and arm pain, numbness Height:     61 inches Weight:      130 pounds O2 Sat:      99 % O2 treatment:    Room Air Temp:     98.5 degrees F oral Pulse rate:   80 / minute Resp:     16 per minute BP sitting:   115 / 82  (left arm) Cuff size:   regular  Pt. in pain?   yes    Location:   right elbow, arm    Type:       tingling  Vitals Entered By: Lajean Saver RN (November 01, 2010 12:35 PM)                   Updated Prior Medication List: BENICAR HCT 20-12.5 MG TABS (OLMESARTAN MEDOXOMIL-HCTZ) once daily TRAMADOL HCL 50 MG TABS (TRAMADOL HCL) 1 q 6-8 hrs as needed pain  Current Allergies: No known allergies History of Present Illness Chief Complaint: worsening neck, elbow and arm pain, numbness History of Present Illness:  Subjective:  Since her last visit, patient has developed increased vague tenderness in her right elbow, noticeable during full extension.  Pain radiates to forearm and is worse at night.  She has had vague intermittent numness in her forearm.  She states that her neck soreness has resolved, and has no pain in her upper back or shoulders.   She admits that on May 16 she spent a day on a [place] with her son, and her elbow became sore later.  REVIEW OF SYSTEMS Constitutional Symptoms      Denies fever, chills, night sweats, weight loss, weight gain, and fatigue.  Eyes       Denies change in vision, eye pain, eye discharge, glasses, contact lenses, and eye surgery. Ear/Nose/Throat/Mouth       Denies hearing loss/aids, change in hearing, ear pain, ear discharge, dizziness, frequent runny nose, frequent nose bleeds, sinus problems, sore throat, hoarseness, and tooth pain or bleeding.  Respiratory       Denies dry cough, productive cough, wheezing, shortness of breath, asthma, bronchitis, and emphysema/COPD.  Cardiovascular     Denies murmurs, chest pain, and tires easily with exhertion.    Gastrointestinal       Denies stomach pain, nausea/vomiting, diarrhea, constipation, blood in bowel movements, and indigestion. Genitourniary       Denies painful urination, blood or discharge from vagina, kidney stones, and loss of urinary control. Neurological       Complains of tingling.      Denies paralysis, seizures, and fainting/blackouts. Musculoskeletal       Complains of muscle pain, joint pain, and joint stiffness.      Denies decreased range of motion, redness, swelling, muscle weakness, and gout.  Skin       Denies bruising, unusual mles/lumps or sores, and hair/skin or nail changes.  Psych       Denies mood changes, temper/anger issues, anxiety/stress, speech problems, depression, and sleep problems. Other Comments: Patient was seen by Dr. Georgina Pillion 3 months ago for same symptoms. He told her to return for changes or worsening. Since then the pain and tingling has worsened, she also has developed pain in her right elbow. She is interested in a referral to a neurologist. She has taken Tramadol for as needed pain relief.  Past History:  Past Medical History: Reviewed history from 08/01/2010 and no changes required. Hypertension  Past Surgical History: Reviewed history from 08/01/2010 and no changes required. oophorectomy 2005  Family History: Reviewed history from 08/01/2010 and no changes required. CVD- both parents  Social History: Never Smoked Alcohol use-no Drug use-no Occupation: RN   Objective:  Appearance:  Patient appears healthy, stated age, and in no acute distress  Neck:  Full range of motion  Right shoulder: Full range of motion  Right elbow:  Full range of motion.  Distinct tenderness over the lateral epicondyle.  Palpation there during resisted dorsifexion and supination of her right wrist recreates her elbow pain.  Distal neurovascular intact and strength  intact. Assessment  Assessed NECK PAIN, ACUTE as improved - Donna Christen MD New Problems: LATERAL EPICONDYLITIS, RIGHT (ICD-726.32)   Plan New Medications/Changes: NAPROXEN 500 MG TABS (NAPROXEN) One by mouth two times a day pc  #20 x 0, 11/01/2010, Donna Christen MD TRAMADOL HCL 50 MG TABS (TRAMADOL HCL) 1 q 6-8 hrs as needed pain  #20 x 0, 11/01/2010, Donna Christen MD  New Orders: Tennis Elbow Support [L3701] Est. Patient Level III [16109] Planning Comments:   Tennis elbow strap applied to right forearm below elbow.  Begin Naproxen for 5 to 7 days.  Begin applying ice pack several times daily.  Begin range of motion and stretching exercises ([facility] information and instruction patient handout given).  May continue tramadol at bedtime. Followup with [facility] if not improved in two to three weeks.   The patient and/or caregiver has been counseled thoroughly with regard to medications prescribed including dosage, schedule, interactions, rationale for use, and possible side effects and they verbalize understanding.  Diagnoses and expected course of recovery discussed and will return if not improved as expected or if the condition worsens. Patient and/or caregiver verbalized understanding.  Prescriptions: NAPROXEN 500 MG TABS (NAPROXEN) One by mouth two times a day pc  #20 x 0   Entered and Authorized by:   Donna Christen MD   Signed by:   Donna Christen MD on 11/01/2010   Method used:   Print then Give to Patient   RxID:   [id number] TRAMADOL HCL 50 MG TABS (TRAMADOL HCL) 1 q 6-8 hrs as needed pain  #20 x 0   Entered and Authorized by:   Donna Christen MD   Signed by:   Donna Christen MD on 11/01/2010   Method used:   Print then Give to Patient   RxID:   [id number]   Orders Added: 1)  Tennis Elbow Support [L3701] 2)  Est. Patient Level III [29528]

## 2011-02-21 ENCOUNTER — Other Ambulatory Visit: Payer: Self-pay | Admitting: Obstetrics and Gynecology

## 2011-02-21 DIAGNOSIS — Z1231 Encounter for screening mammogram for malignant neoplasm of breast: Secondary | ICD-10-CM

## 2011-02-26 ENCOUNTER — Ambulatory Visit
Admission: RE | Admit: 2011-02-26 | Discharge: 2011-02-26 | Disposition: A | Discharge status: Home / Self Care | Payer: 59 | Source: Ambulatory Visit | Attending: Obstetrics and Gynecology | Admitting: Obstetrics and Gynecology

## 2011-02-26 DIAGNOSIS — Z1231 Encounter for screening mammogram for malignant neoplasm of breast: Secondary | ICD-10-CM

## 2011-03-04 MED ORDER — TRAMADOL HCL 50 MG PO TABS: 50.0000 mg | ORAL_TABLET | Freq: Three times a day (TID) | ORAL | Status: DC | PRN

## 2011-03-04 NOTE — Progress Notes (Signed)
Addended by: Lenda Kelp on: 03/04/2011 12:42 PM   Modules accepted: Orders

## 2011-05-06 ENCOUNTER — Other Ambulatory Visit: Payer: Self-pay | Admitting: Internal Medicine

## 2011-05-06 NOTE — Telephone Encounter (Signed)
Opened in error

## 2011-12-08 IMAGING — US US PELVIS COMPLETE
1 series · 14 of 25 positions shown · non-contrast
Comparison: None.

CLINICAL DATA: Elevated QY-TAF level. Evaluate for left ovarian
mass.  Previous right oophrectomy.

TRANSABDOMINAL AND TRANSVAGINAL ULTRASOUND OF PELVIS
TECHNIQUE: Both transabdominal and transvaginal ultrasound
examinations of the pelvis were performed.  Transabdominal
technique was performed for global imaging of the pelvis including
uterus, ovaries, adnexal regions, and pelvic cul-de-sac.
It was necessary to proceed with endovaginal exam following the
transabdominal exam to visualize the endometrium, the left ovary,
and right adnexa.

[Series 1: us pelvis complete · 0.25mm/px · 14 of 30 slices shown]
[im 1/30]
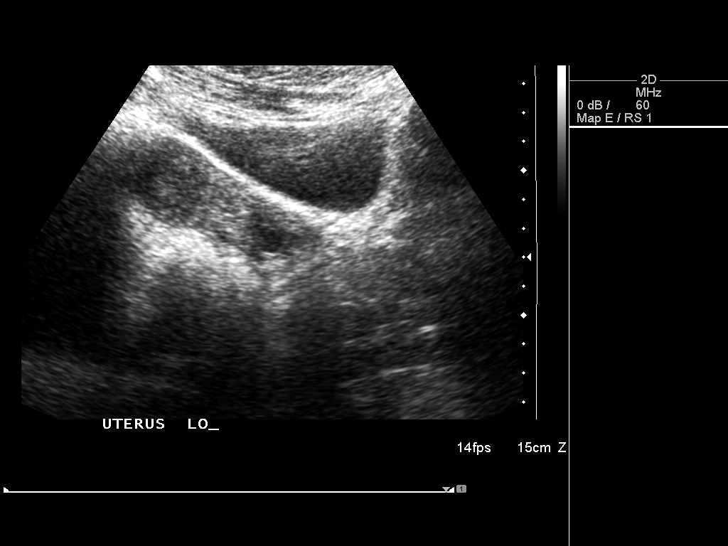
[im 3/30]
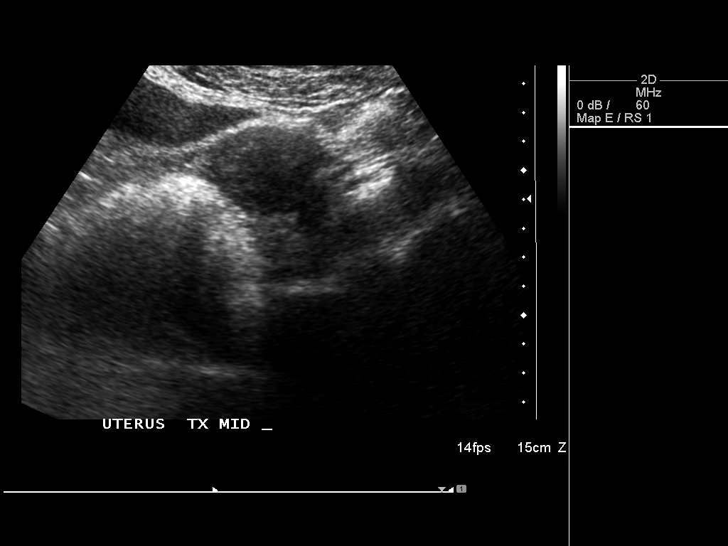
[im 5/30]
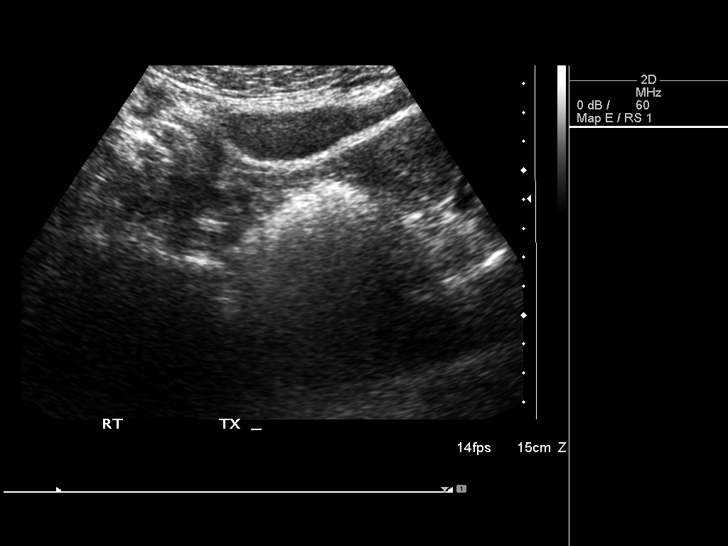
[im 8/30]
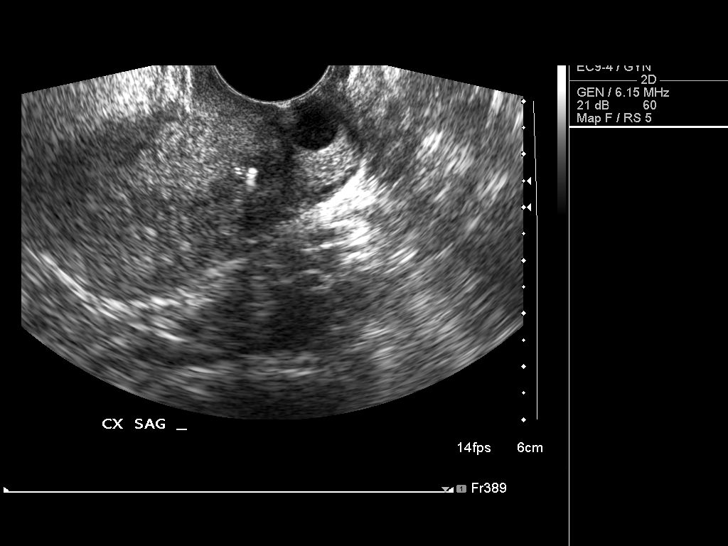
[im 10/30]
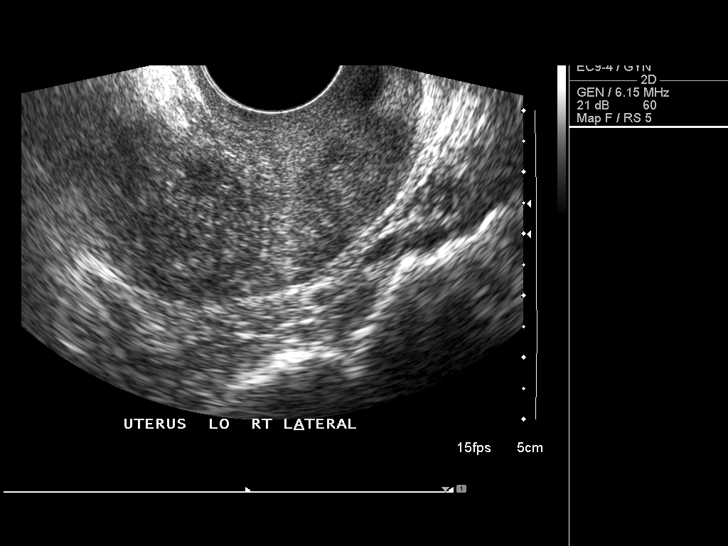
[im 11/30]
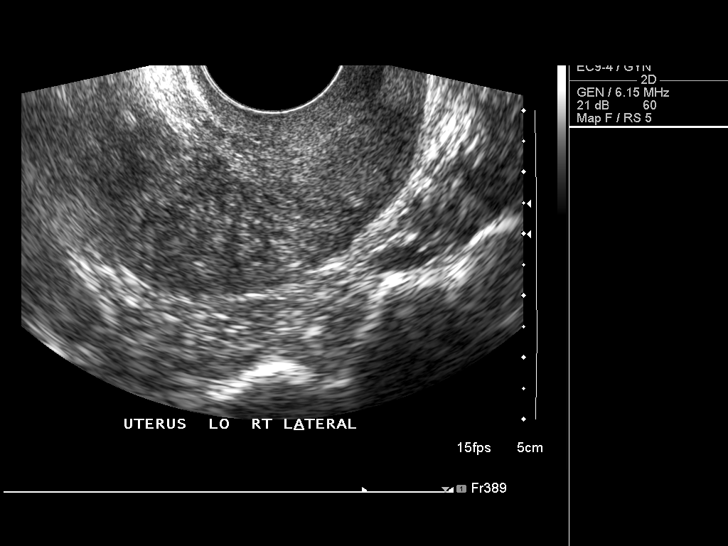
[im 14/30]
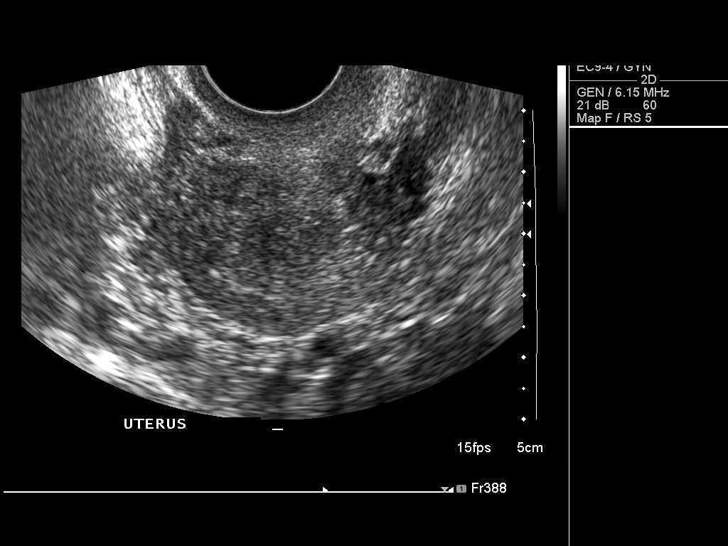
[im 16/30]
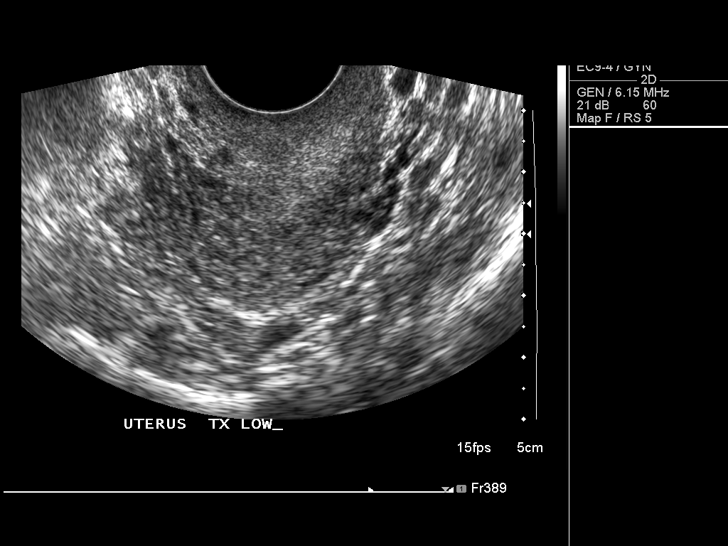
[im 19/30]
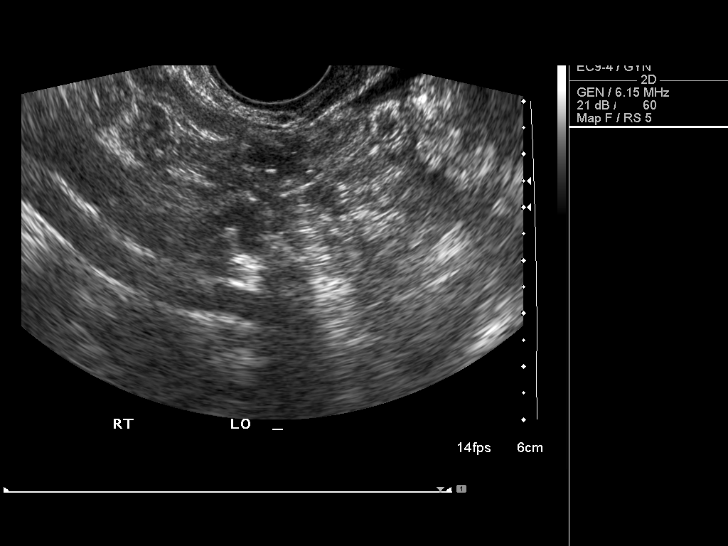
[im 20/30]
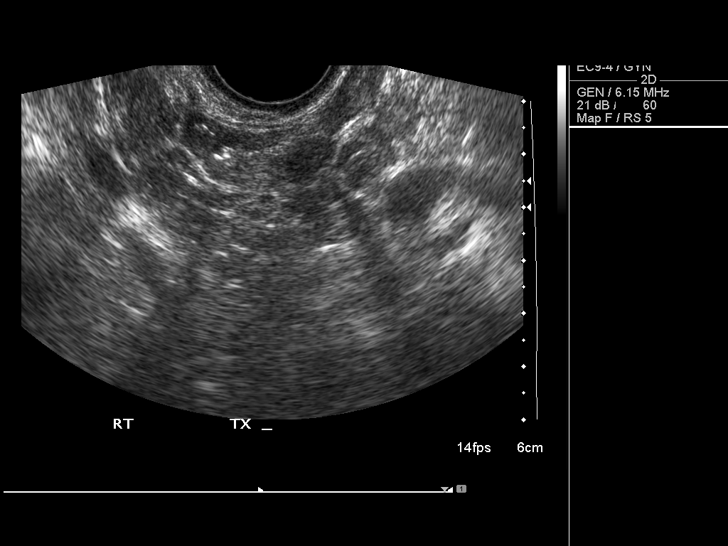
[im 22/30]
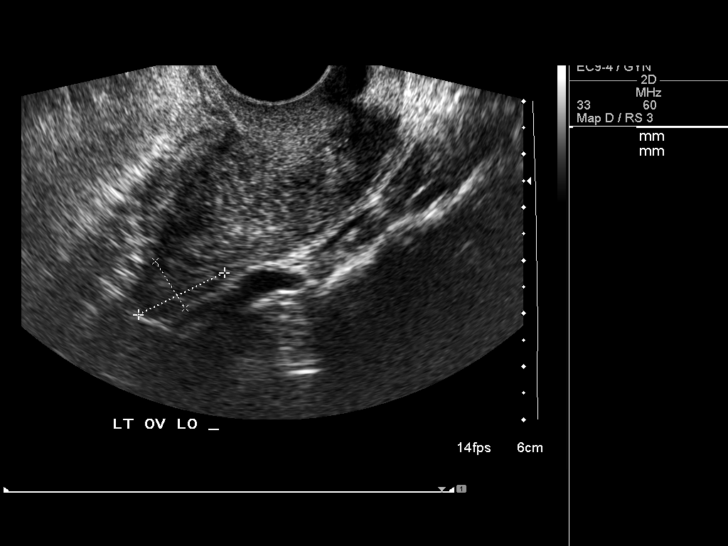
[im 25/30]
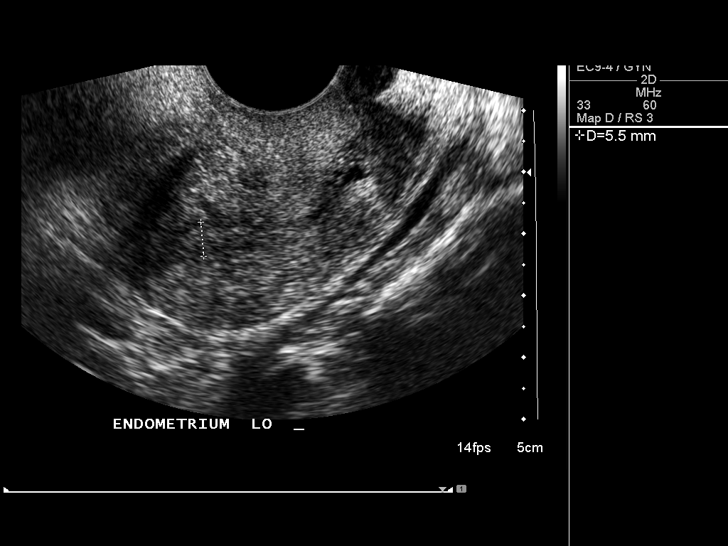
[im 27/30]
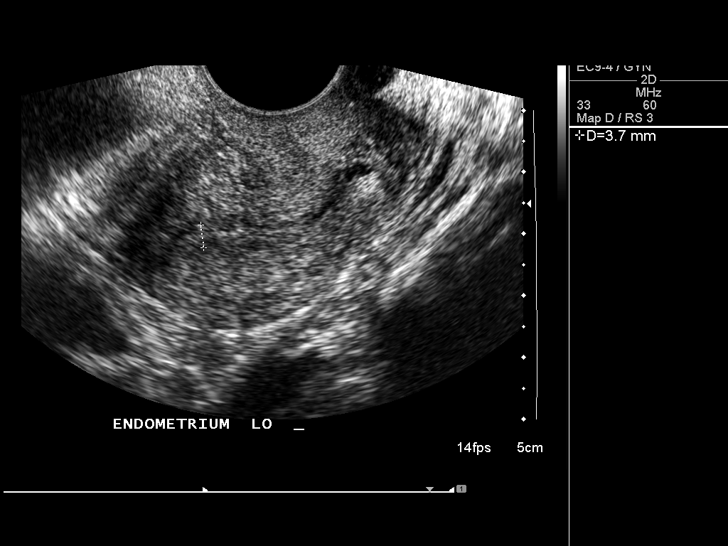
[im 30/30]
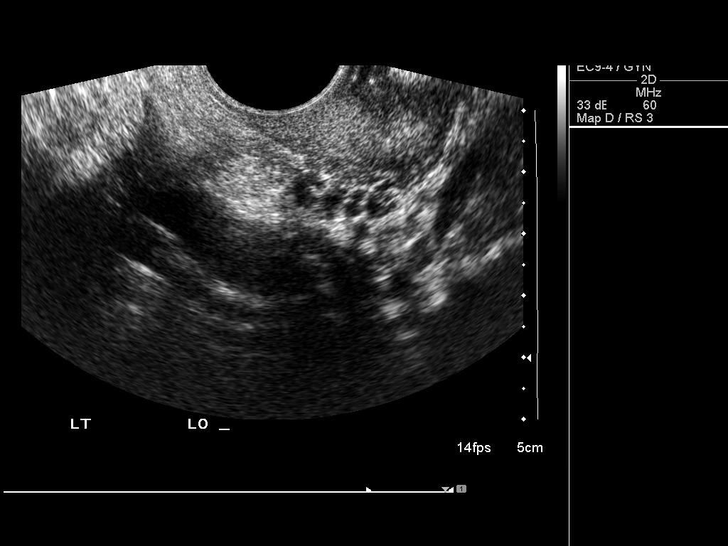

[14 of 25 positions shown; findings below may reference images not displayed]

FINDINGS: Uterus:  6.3 x 2.8 x 3.9 cm.  No fibroids or other uterine masses
identified.

Endometrium:  4 mm double layer thickness measured transvaginally.

Right ovary:  Surgically absent.  No adnexal mass identified.

Left ovary:  1.8 x 1.1 x 1.5 cm.  Normal postmenopausal appearance.
No cystic or solid ovarian lesion identified.

Other findings:  No free fluid.
IMPRESSION: 1.  Normal appearance of left ovary.  Previous right oophrectomy.
No evidence of ovarian or adnexal mass.
2.  Normal uterus.

## 2011-12-09 ENCOUNTER — Ambulatory Visit (INDEPENDENT_AMBULATORY_CARE_PROVIDER_SITE_OTHER): Payer: 59 | Admitting: Family Medicine

## 2011-12-09 ENCOUNTER — Encounter: Payer: Self-pay | Admitting: Family Medicine

## 2011-12-09 VITALS — BP 112/78 | HR 83 | Ht 61.0 in | Wt 130.0 lb

## 2011-12-09 DIAGNOSIS — R209 Unspecified disturbances of skin sensation: Secondary | ICD-10-CM

## 2011-12-09 DIAGNOSIS — R200 Anesthesia of skin: Secondary | ICD-10-CM

## 2011-12-09 DIAGNOSIS — M542 Cervicalgia: Secondary | ICD-10-CM

## 2011-12-09 MED ORDER — TRAMADOL HCL 50 MG PO TABS: 50.0000 mg | ORAL_TABLET | Freq: Three times a day (TID) | ORAL | Status: DC | PRN

## 2011-12-10 ENCOUNTER — Encounter: Payer: Self-pay | Admitting: Family Medicine

## 2011-12-10 DIAGNOSIS — R200 Anesthesia of skin: Secondary | ICD-10-CM | POA: Insufficient documentation

## 2011-12-10 NOTE — Assessment & Plan Note (Signed)
Right arm/hand numbness - She has prior history of right sided neck pain, known DDD but her symptoms currently are more likely due to carpal tunnel - numbness is in entire hand though which is not specific to median nerve distribution.  Start with cockup wrist splint, nsaids, tramadol as needed.  Declined prednisone.  Consider nerve conduction studies if not improving.

## 2011-12-10 NOTE — Progress Notes (Signed)
Subjective:    Patient ID: Alexis Lane, female    DOB: December 12, 1960, 51 y.o.   MRN: [id number]  PCP: Judge Stall  Arm Pain    51 yo F here for neck, right arm pain.  12/18/10: Patient reports several months ago she recalls having woken up with pain in posterior neck. Went to ED and had radiographs done showing DDD at C5-6 and C6-7 levels. She was prescribed tramadol then which helped. She also went on to have acupuncture which also helped to resolve this over time. Very active with yoga and gets regular massages. Then approximately 2 weeks ago she developed similar pain in posterior neck more on left side that has persisted. Believes this is likely related to increased stress from work. Feels more with neck flexion and left lateral rotation. Has tried stretching, ibuprofen, heat and ice. No numbness or tingling. No bowel or bladder dysfunction. Some radiation into left > right arms but nothing consistent.  12/09/11: Patient states the intermittent tingling she was getting in arms has become more prominent on right arm. Especially worsened over past few months. Woke up this morning with throbbing palmar wrist into right hand with numbness. Helped by tramadol. Also taking ibuprofen. Has these symptoms every day but this morning was worst it has been. Neck pain has improved though questioning if symptoms are starting there - no radiation to shoulder or upper arm though. No bowel/bladder dysfunction.  Past Medical History  Diagnosis Date  . Hypertension   . Kidney stones     Current Outpatient Prescriptions on File Prior to Visit  Medication Sig Dispense Refill  . Cholecalciferol (VITAMIN D) 1000 UNITS capsule Take 1,000 Units by mouth daily.        . Coenzyme Q10 (CO Q-10) 100 MG CAPS Take 1 tablet by mouth daily.        . Magnesium 500 MG CAPS Take 1 capsule by mouth daily.        . meloxicam (MOBIC) 15 MG tablet Take 1 tablet (15 mg total) by mouth daily. With food.   30 tablet  1  . Multiple Vitamin (MULTIVITAMIN) tablet Take 1 tablet by mouth daily.        Marland Kitchen olmesartan-hydrochlorothiazide (BENICAR HCT) 20-12.5 MG per tablet Take 1 tablet by mouth at bedtime.        . Omega-3 Fatty Acids (FISH OIL) 1000 MG CAPS Take 1 capsule by mouth daily.        Marland Kitchen OVER THE COUNTER MEDICATION Apply 20 mg topically daily. PRO-GEST TOPICAL CREAM         Past Surgical History  Procedure Date  . Right oophorectomy 2006    No Known Allergies  History   Social History  . Marital Status: Divorced    Spouse Name: N/A    Number of Children: N/A  . Years of Education: N/A   Occupational History  . Not on file.   Social History Main Topics  . Smoking status: Former Games developer  . Smokeless tobacco: Never Used  . Alcohol Use: No  . Drug Use: No  . Sexually Active: Not on file   Other Topics Concern  . Not on file   Social History Narrative  . No narrative on file    Family History  Problem Relation Age of Onset  . Lung cancer Mother 53  . Heart attack Mother   . Hypertension Mother   . Colon cancer Neg Hx   . Diabetes Neg Hx   . Hyperlipidemia Neg  Hx   . Sudden death Neg Hx   . Heart attack Father   . Hypertension Maternal Aunt   . Hypertension Maternal Grandmother   . Heart attack Maternal Grandfather     BP 112/78  Pulse 83  Ht 5\' 1"  (1.549 m)  Wt 130 lb (58.968 kg)  BMI 24.56 kg/m2  Review of Systems  See HPI above.    Objective:   Physical Exam  Neck: No gross deformity, swelling, bruising. No paraspinal TTP.  No midline/bony TTP. FROM neck without pain. BUE strength 5/5.  Sensation intact to light touch.   2+ equal reflexes in triceps, biceps, brachioradialis tendons. Negative spurlings. NV intact distal BUEs.  R wrist: No gross deformity, swelling, bruising. No TTP. Negative tinels and phalens. NVI distally currently.    Assessment & Plan:  1. Right arm/hand numbness - She has prior history of right sided neck pain, known  DDD but her symptoms currently are more likely due to carpal tunnel - numbness is in entire hand though which is not specific to median nerve distribution.  Start with cockup wrist splint, nsaids, tramadol as needed.  Declined prednisone.  Consider nerve conduction studies if not improving.

## 2012-01-14 ENCOUNTER — Ambulatory Visit: Payer: 59 | Admitting: Family Medicine

## 2012-02-11 ENCOUNTER — Ambulatory Visit (INDEPENDENT_AMBULATORY_CARE_PROVIDER_SITE_OTHER): Payer: Self-pay | Admitting: Family Medicine

## 2012-02-11 ENCOUNTER — Encounter: Payer: Self-pay | Admitting: Family Medicine

## 2012-02-11 VITALS — BP 108/78 | HR 105 | Ht 61.0 in | Wt 130.0 lb

## 2012-02-11 DIAGNOSIS — R200 Anesthesia of skin: Secondary | ICD-10-CM

## 2012-02-11 DIAGNOSIS — R209 Unspecified disturbances of skin sensation: Secondary | ICD-10-CM

## 2012-02-12 ENCOUNTER — Encounter: Payer: Self-pay | Admitting: Family Medicine

## 2012-02-12 NOTE — Progress Notes (Signed)
Subjective:    Patient ID: Alexis Lane, female    DOB: 16-Aug-1960, 52 y.o.   MRN: [id number]  PCP: Judge Stall  Arm Pain    52 yo F here for f/u neck, right arm pain.  12/18/10: Patient reports several months ago she recalls having woken up with pain in posterior neck. Went to ED and had radiographs done showing DDD at C5-6 and C6-7 levels. She was prescribed tramadol then which helped. She also went on to have acupuncture which also helped to resolve this over time. Very active with yoga and gets regular massages. Then approximately 2 weeks ago she developed similar pain in posterior neck more on left side that has persisted. Believes this is likely related to increased stress from work. Feels more with neck flexion and left lateral rotation. Has tried stretching, ibuprofen, heat and ice. No numbness or tingling. No bowel or bladder dysfunction. Some radiation into left > right arms but nothing consistent.  12/09/11: Patient states the intermittent tingling she was getting in arms has become more prominent on right arm. Especially worsened over past few months. Woke up this morning with throbbing palmar wrist into right hand with numbness. Helped by tramadol. Also taking ibuprofen. Has these symptoms every day but this morning was worst it has been. Neck pain has improved though questioning if symptoms are starting there - no radiation to shoulder or upper arm though. No bowel/bladder dysfunction.  12/31: Patient reports she has been unable to wear wrist brace at nighttime - seems to cause wrist and hand to throb more. Still predominantly with right wrist pain with numbness/tingling from wrist into fingers. Tramadol has been helping, ibuprofen helps her sleep. Able to tolerate brace during the day. Still also having right sided neck pain but still does not radiate from there into the right arm. No bowel/bladder dysfunction.  Past Medical History  Diagnosis Date  .  Hypertension   . Kidney stones     Current Outpatient Prescriptions on File Prior to Visit  Medication Sig Dispense Refill  . Cholecalciferol (VITAMIN D) 1000 UNITS capsule Take 1,000 Units by mouth daily.        . Coenzyme Q10 (CO Q-10) 100 MG CAPS Take 1 tablet by mouth daily.        . Magnesium 500 MG CAPS Take 1 capsule by mouth daily.        . Multiple Vitamin (MULTIVITAMIN) tablet Take 1 tablet by mouth daily.        Marland Kitchen olmesartan-hydrochlorothiazide (BENICAR HCT) 20-12.5 MG per tablet Take 1 tablet by mouth at bedtime.        . Omega-3 Fatty Acids (FISH OIL) 1000 MG CAPS Take 1 capsule by mouth daily.        Marland Kitchen OVER THE COUNTER MEDICATION Apply 20 mg topically daily. PRO-GEST TOPICAL CREAM       . traMADol (ULTRAM) 50 MG tablet Take 1 tablet (50 mg total) by mouth every 8 (eight) hours as needed for pain. Maximum dose= 8 tablets per day  90 tablet  1    Past Surgical History  Procedure Date  . Right oophorectomy 2006    No Known Allergies  History   Social History  . Marital Status: Divorced    Spouse Name: N/A    Number of Children: N/A  . Years of Education: N/A   Occupational History  . Not on file.   Social History Main Topics  . Smoking status: Former Games developer  . Smokeless tobacco: Never  Used  . Alcohol Use: No  . Drug Use: No  . Sexually Active: Not on file   Other Topics Concern  . Not on file   Social History Narrative  . No narrative on file    Family History  Problem Relation Age of Onset  . Lung cancer Mother 52  . Heart attack Mother   . Hypertension Mother   . Colon cancer Neg Hx   . Diabetes Neg Hx   . Hyperlipidemia Neg Hx   . Sudden death Neg Hx   . Heart attack Father   . Hypertension Maternal Aunt   . Hypertension Maternal Grandmother   . Heart attack Maternal Grandfather     BP 108/78  Pulse 105  Ht 5\' 1"  (1.549 m)  Wt 130 lb (58.968 kg)  BMI 24.56 kg/m2  Review of Systems  See HPI above.    Objective:   Physical  Exam  Neck: No gross deformity, swelling, bruising. No paraspinal TTP.  No midline/bony TTP. FROM neck without pain. BUE strength 5/5.   R wrist: No gross deformity, swelling, bruising, atrophy. No TTP. Phalens positive.  Negative tinels.   Strength 5/5 with finger abduction, extension, thumb opposition. NVI distally currently.    Assessment & Plan:  1. Right arm/hand numbness - Most consistent with carpal tunnel syndrome but cannot tolerate wrist brace unfortunately at nighttime.  She's going to try to use this during day.  Due to insurance has to wait a month and a half for additional testing.  Would start with nerve conduction studies/EMGs to confirm diagnosis, ensure not coming from radiculopathy.  She will call us when she can go ahead with this testing.  Nsaids, tramadol as needed in meantime.

## 2012-02-12 NOTE — Assessment & Plan Note (Signed)
Most consistent with carpal tunnel syndrome but cannot tolerate wrist brace unfortunately at nighttime.  She's going to try to use this during day.  Due to insurance has to wait a month and a half for additional testing.  Would start with nerve conduction studies/EMGs to confirm diagnosis, ensure not coming from radiculopathy.  She will call us when she can go ahead with this testing.  Nsaids, tramadol as needed in meantime.

## 2012-05-05 ENCOUNTER — Other Ambulatory Visit: Payer: Self-pay | Admitting: Family Medicine

## 2012-05-05 ENCOUNTER — Other Ambulatory Visit: Payer: Self-pay | Admitting: *Deleted

## 2012-05-05 DIAGNOSIS — M542 Cervicalgia: Secondary | ICD-10-CM

## 2012-05-05 MED ORDER — TRAMADOL HCL 50 MG PO TABS: 50.0000 mg | ORAL_TABLET | Freq: Three times a day (TID) | ORAL | Status: AC | PRN

## 2012-07-22 ENCOUNTER — Other Ambulatory Visit: Payer: Self-pay | Admitting: Family Medicine

## 2012-07-24 ENCOUNTER — Other Ambulatory Visit: Payer: Self-pay | Admitting: *Deleted

## 2012-07-24 DIAGNOSIS — M542 Cervicalgia: Secondary | ICD-10-CM

## 2013-05-24 ENCOUNTER — Emergency Department
Admission: EM | Admit: 2013-05-24 | Discharge: 2013-05-24 | Disposition: A | Discharge status: Home / Self Care | Payer: 59 | Source: Home / Self Care | Attending: Family Medicine | Admitting: Family Medicine

## 2013-05-24 ENCOUNTER — Encounter: Payer: Self-pay | Admitting: Emergency Medicine

## 2013-05-24 DIAGNOSIS — R21 Rash and other nonspecific skin eruption: Secondary | ICD-10-CM

## 2013-05-24 NOTE — ED Notes (Signed)
Pt c/o rash on her abd and upper thighs x 1 day. 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 She denies fever, respiratory s/s, N/V/D or body aches.

## 2013-05-24 NOTE — ED Provider Notes (Signed)
CSN: [id number]     Arrival date & time 05/24/13  1132 History   First MD Initiated Contact with Patient 05/24/13 1142     Chief Complaint  Patient presents with  . Rash    HPI  HPI  This patient complains of a RASH  Location: LEs, abdomen  Onset:  1day  Course: generalized faint papular rash on abdomen and LEs. Non tender. No systemic sxs including fever, chills, body aches, pruritis, pain  Modifying Factors: came back from 3 week medical mission trip from [place] 3/31. Had ? Bug bite 1 week ago on abdomen. Rash developed this am. Denies having any illness while in [place]. Immunizations UTD.  Self-treated with:  Nothing  Improvement with treatment: n/a  History  Itching: no  Tenderness: no  New medications/antibiotics: no  Pet exposure: no  Recent travel or tropical exposure: yes  New soaps, shampoos, detergent, clothing: no  Tick/insect exposure: no  Chemical Exposure: no  Red Flags  Feeling ill: no  Fever: no  Facial/tongue swelling/difficulty breathing: no  Diabetic or immunocompromised: no    Past Medical History  Diagnosis Date  . Hypertension   . Kidney stones    Past Surgical History  Procedure Laterality Date  . Right oophorectomy  2006   Family History  Problem Relation Age of Onset  . Lung cancer Mother [id number]  . Heart attack Mother   . Hypertension Mother   . Colon cancer Neg Hx   . Diabetes Neg Hx   . Hyperlipidemia Neg Hx   . Sudden death Neg Hx   . Heart attack Father   . Hypertension Maternal Aunt   . Hypertension Maternal Grandmother   . Heart attack Maternal Grandfather    History  Substance Use Topics  . Smoking status: Former Games developermoker  . Smokeless tobacco: Never Used  . Alcohol Use: No   OB History   Grav Para Term Preterm Abortions TAB SAB Ect Mult Living                 Review of Systems  All other systems reviewed and are negative.   Allergies  Review of patient's allergies indicates no known allergies.  Home Medications    Current Outpatient Rx  Name  Route  Sig  Dispense  Refill  . Cholecalciferol (VITAMIN D) 1000 UNITS capsule   Oral   Take 1,000 Units by mouth daily.           . Coenzyme Q10 (CO Q-10) 100 MG CAPS   Oral   Take 1 tablet by mouth daily.           . Magnesium 500 MG CAPS   Oral   Take 1 capsule by mouth daily.           . Multiple Vitamin (MULTIVITAMIN) tablet   Oral   Take 1 tablet by mouth daily.           Marland Kitchen. olmesartan-hydrochlorothiazide (BENICAR HCT) 20-12.5 MG per tablet   Oral   Take 1 tablet by mouth at bedtime.           . Omega-3 Fatty Acids (FISH OIL) 1000 MG CAPS   Oral   Take 1 capsule by mouth daily.           Marland Kitchen. OVER THE COUNTER MEDICATION   Topical   Apply 20 mg topically daily. PRO-GEST TOPICAL CREAM           There were no vitals taken for this visit. Physical  Exam  Constitutional: She appears well-developed and well-nourished.  HENT:  Head: Normocephalic and atraumatic.  Eyes: Conjunctivae are normal. Pupils are equal, round, and reactive to light.  Neck: Normal range of motion. Neck supple.  Cardiovascular: Normal rate and regular rhythm.   Pulmonary/Chest: Effort normal and breath sounds normal.  Abdominal: Soft.  Musculoskeletal: Normal range of motion.  Neurological: She is alert.  Skin: Rash noted.   Faint papular blanching lesions across abdomen.  ED Course  Procedures (including critical care time) Labs Review Labs Reviewed - No data to display Imaging Review No results found.   MDM   1. Rash and nonspecific skin eruption    Fairly broad ddx including insect bite, viral exanthem, allergic reaction, vasculitis.  No systemic sxs currently.  Reassuring exam. Suspect viral exanthem given distribution and recent travel. ? Subclinical viral exposure.  Will refer to dermatology for follow up if rash persists.  Discussed blood work. Will defer as pt is otherwise asymptomatic.  Discussed general red flags Follow up as  needed.    The patient and/or caregiver has been counseled thoroughly with regard to treatment plan and/or medications prescribed including dosage, schedule, interactions, rationale for use, and possible side effects and they verbalize understanding. Diagnoses and expected course of recovery discussed and will return if not improved as expected or if the condition worsens. Patient and/or caregiver verbalized understanding.         Doree AlbeeSteven Cordai Rodrigue, MD 05/24/13 1159

## 2013-05-25 ENCOUNTER — Telehealth: Payer: Self-pay | Admitting: *Deleted

## 2013-06-24 ENCOUNTER — Telehealth: Payer: Self-pay | Admitting: Internal Medicine

## 2013-06-24 NOTE — Telephone Encounter (Signed)
Patient is interested in having her hemorrhoids banded. She wants to discuss types of banding. Scheduled with Dr. Juanda ChanceBrodie on 07/06/13 at 2:30 PM.

## 2013-06-28 ENCOUNTER — Encounter: Payer: Self-pay | Admitting: *Deleted

## 2013-06-30 ENCOUNTER — Ambulatory Visit (INDEPENDENT_AMBULATORY_CARE_PROVIDER_SITE_OTHER): Payer: 59 | Admitting: Family Medicine

## 2013-06-30 ENCOUNTER — Encounter: Payer: Self-pay | Admitting: Family Medicine

## 2013-06-30 VITALS — BP 110/75 | HR 93 | Ht 61.0 in | Wt 137.0 lb

## 2013-06-30 DIAGNOSIS — M545 Low back pain, unspecified: Secondary | ICD-10-CM

## 2013-06-30 MED ORDER — TRAMADOL HCL 50 MG PO TABS: 50.0000 mg | ORAL_TABLET | Freq: Three times a day (TID) | ORAL | Status: AC | PRN

## 2013-06-30 NOTE — Patient Instructions (Signed)
Your back pain is likely due to arthritis. Take tylenol 500mg  1-2 tabs three times a day for pain. Aleve 1-2 tabs twice a day with food Glucosamine sulfate 750mg  twice a day is a supplement that may help. Capsaicin topically up to four times a day may also help with pain. We will try to obtain your x-rays so I can review them. Tramadol as needed for severe pain. Cortisone injections are an option. It's important that you continue to stay active. Do home exercises as directed. Consider physical therapy to strengthen muscles around the joint that hurts to take pressure off of the joint itself. Heat or ice 15 minutes at a time 3-4 times a day as needed to help with pain. Water aerobics and cycling with low resistance are the best two types of exercise for arthritis. Follow up usually in 5-6 weeks for reevaluation.

## 2013-07-02 ENCOUNTER — Encounter: Payer: Self-pay | Admitting: Family Medicine

## 2013-07-02 DIAGNOSIS — M545 Low back pain, unspecified: Secondary | ICD-10-CM | POA: Insufficient documentation

## 2013-07-02 NOTE — Assessment & Plan Note (Signed)
history and exam most consistent with arthritis.  Will attempt to get copies of her x-rays - ROI filled out today.  Start home exercise program.  Tylenol, nsaids, glucosamine, capsaicin all discussed.  Tramadol as needed for severe pain.  Heat or ice as needed.  F/u in 5-6 weeks.  Consider physical therapy.

## 2013-07-02 NOTE — Progress Notes (Signed)
Patient ID: Alexis Lane, female   DOB: [birth year], 53 y.o.   MRN: [id number]  PCP: No primary provider on file.  Subjective:   HPI: Patient is a 53 y.o. female here for low back pain.  Patient reports she's had low back pain for about 9 months now. Comes and goes but becoming more frequent. Naproxen works at times. Does yoga and walks for exercise. No radiation of pain into legs. No night pain. No numbness or tingling. Pain worse with activities. And is starting to limit what she wants to do. Saw a chiropractor and had x-rays (not with her today) - told only mild scoliosis.  Past Medical History  Diagnosis Date  . Hypertension   . Kidney stones   . Internal hemorrhoids   . Melanosis     Current Outpatient Prescriptions on File Prior to Visit  Medication Sig Dispense Refill  . Cholecalciferol (VITAMIN D) 1000 UNITS capsule Take 1,000 Units by mouth daily.        . Coenzyme Q10 (CO Q-10) 100 MG CAPS Take 1 tablet by mouth daily.        . Magnesium 500 MG CAPS Take 1 capsule by mouth daily.        . Multiple Vitamin (MULTIVITAMIN) tablet Take 1 tablet by mouth daily.        Marland Kitchen olmesartan-hydrochlorothiazide (BENICAR HCT) 20-12.5 MG per tablet Take 1 tablet by mouth at bedtime.        . Omega-3 Fatty Acids (FISH OIL) 1000 MG CAPS Take 1 capsule by mouth daily.        Marland Kitchen OVER THE COUNTER MEDICATION Apply 20 mg topically daily. PRO-GEST TOPICAL CREAM        No current facility-administered medications on file prior to visit.    Past Surgical History  Procedure Laterality Date  . Right oophorectomy  2006    No Known Allergies  History   Social History  . Marital Status: Divorced    Spouse Name: N/A    Number of Children: N/A  . Years of Education: N/A   Occupational History  . Not on file.   Social History Main Topics  . Smoking status: Former Games developer  . Smokeless tobacco: Never Used  . Alcohol Use: No  . Drug Use: No  . Sexual Activity: Not on file   Other  Topics Concern  . Not on file   Social History Narrative  . No narrative on file    Family History  Problem Relation Age of Onset  . Lung cancer Mother 31  . Heart attack Mother   . Hypertension Mother   . Colon cancer Neg Hx   . Diabetes Neg Hx   . Hyperlipidemia Neg Hx   . Sudden death Neg Hx   . Heart attack Father   . Hypertension Maternal Aunt   . Hypertension Maternal Grandmother   . Heart attack Maternal Grandfather     BP 110/75  Pulse 93  Ht 5\' 1"  (1.549 m)  Wt 137 lb (62.143 kg)  BMI 25.90 kg/m2  LMP 06/17/2010  Review of Systems: See HPI above.    Objective:  Physical Exam:  Gen: NAD  Back: No gross deformity, scoliosis. No focal TTP .  No midline or bony TTP. FROM without pain currently. Strength LEs 5/5 all muscle groups.   2+ MSRs in patellar and achilles tendons, equal bilaterally. Negative SLRs. Sensation intact to light touch bilaterally. Negative logroll bilateral hips Negative fabers and piriformis stretches.  Assessment & Plan:  1. Low back pain - history and exam most consistent with arthritis.  Will attempt to get copies of her x-rays - ROI filled out today.  Start home exercise program.  Tylenol, nsaids, glucosamine, capsaicin all discussed.  Tramadol as needed for severe pain.  Heat or ice as needed.  F/u in 5-6 weeks.  Consider physical therapy.

## 2013-07-06 ENCOUNTER — Ambulatory Visit: Payer: 59 | Admitting: Internal Medicine

## 2014-09-17 ENCOUNTER — Ambulatory Visit (HOSPITAL_COMMUNITY)
Admission: RE | Admit: 2014-09-17 | Discharge: 2014-09-17 | Disposition: A | Discharge status: Home / Self Care | Payer: 59 | Source: Ambulatory Visit | Attending: Occupational Medicine | Admitting: Occupational Medicine

## 2014-09-17 ENCOUNTER — Other Ambulatory Visit: Payer: Self-pay | Admitting: Occupational Medicine

## 2014-09-17 ENCOUNTER — Ambulatory Visit (HOSPITAL_COMMUNITY)
Admission: RE | Admit: 2014-09-17 | Discharge: 2014-09-17 | Disposition: A | Discharge status: Home / Self Care | Payer: Self-pay | Source: Ambulatory Visit | Attending: Occupational Medicine | Admitting: Occupational Medicine

## 2014-09-17 DIAGNOSIS — Z0289 Encounter for other administrative examinations: Secondary | ICD-10-CM

## 2014-09-17 DIAGNOSIS — R7611 Nonspecific reaction to tuberculin skin test without active tuberculosis: Secondary | ICD-10-CM | POA: Insufficient documentation

## 2015-02-17 MED FILL — OLMESARTAN-HCTZ 20-12.5 MG: 20-12.5 | 30 days supply | Qty: 30 | Fill #6

## 2015-04-07 MED FILL — OLMESARTAN-HCTZ 20-12.5 MG: 20-12.5 | 30 days supply | Qty: 30 | Fill #0

## 2015-07-13 MED FILL — OLMESARTAN-HCTZ 20-12.5 MG: 20-12.5 | 5 days supply | Qty: 5 | Fill #0

## 2015-07-21 MED FILL — OLMESARTAN-HCTZ 20-12.5 MG: 20-12.5 | 30 days supply | Qty: 30 | Fill #0

## 2015-08-28 MED FILL — OLMESARTAN-HCTZ 20-12.5 MG: 20-12.5 | 30 days supply | Qty: 30 | Fill #1

## 2015-11-08 MED FILL — OLMESARTAN-HCTZ 20-12.5 MG: 20-12.5 | 30 days supply | Qty: 30 | Fill #2

## 2015-12-27 IMAGING — CR DG CHEST 1V
1 series · 1 of 1 positions shown · non-contrast
Comparison: None.

CLINICAL DATA: Positive TB test after occupational exposure

EXAM:
CHEST  1 VIEW

[w chest pa]
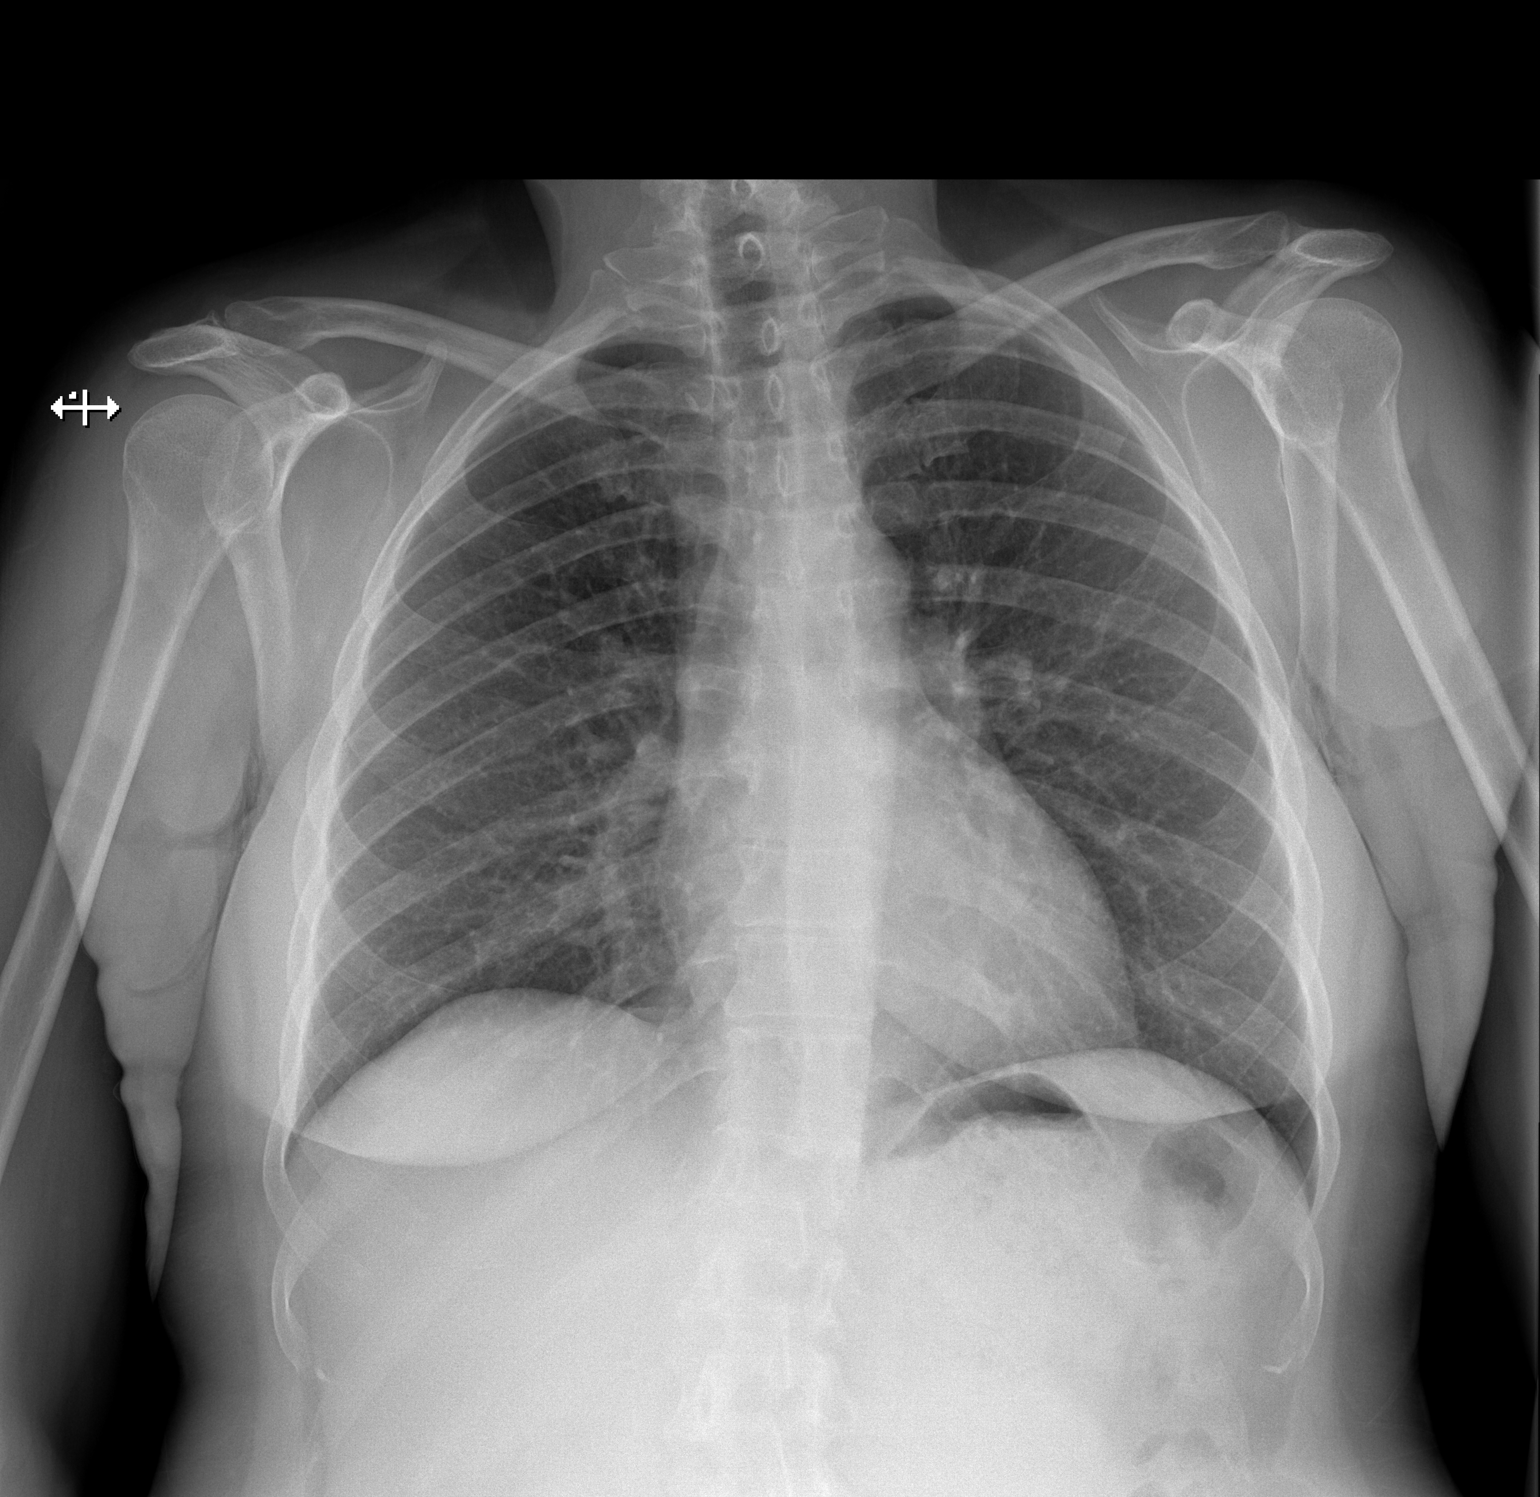

[1 of 1 positions shown; findings below may reference images not displayed]

FINDINGS: The heart size and mediastinal contours are within normal limits.
Both lungs are clear. The visualized skeletal structures are
unremarkable.
IMPRESSION: No active disease.

## 2015-12-27 MED FILL — OLMESARTAN-HCTZ 20-12.5 MG: 20-12.5 | 30 days supply | Qty: 30 | Fill #3

## 2016-03-01 MED FILL — OLMESARTAN-HCTZ 20-12.5 MG: 20-12.5 | 30 days supply | Qty: 30 | Fill #4

## 2016-04-12 MED FILL — OLMESARTAN-HCTZ 20-12.5 MG: 20-12.5 | 30 days supply | Qty: 30 | Fill #5

## 2016-05-17 MED FILL — OLMESARTAN-HCTZ 20-12.5 MG: 20-12.5 | 30 days supply | Qty: 30 | Fill #6

## 2016-06-19 MED FILL — OLMESARTAN-HCTZ 20-12.5 MG: 20-12.5 | 30 days supply | Qty: 30 | Fill #7

## 2016-07-29 MED FILL — OLMESARTAN-HCTZ 20-12.5 MG: 20-12.5 | 90 days supply | Qty: 90 | Fill #0

## 2017-02-17 MED FILL — OLMESARTAN-HCTZ 20-12.5 MG: 20-12.5 | 90 days supply | Qty: 90 | Fill #0

## 2017-02-17 MED FILL — ONDANSETRON ODT 8 MG TABLET: 8 | 8 days supply | Qty: 30 | Fill #0

## 2017-02-17 MED FILL — ESTRADIOL 0.1 MG/GM CREA: 0.1 | 30 days supply | Qty: 43 | Fill #0

## 2017-06-06 MED FILL — ESTRADIOL 0.1 MG/GM CREA: 0.1 | 30 days supply | Qty: 43 | Fill #1

## 2017-06-06 MED FILL — OLMESARTAN-HCTZ 20-12.5 MG: 20-12.5 | 30 days supply | Qty: 30 | Fill #1

## 2017-08-18 MED FILL — OLMESARTAN-HCTZ 20-12.5 MG: 20-12.5 | 90 days supply | Qty: 90 | Fill #2

## 2017-10-28 MED FILL — OLMESARTAN-HCTZ 20-12.5 MG: 20-12.5 | 90 days supply | Qty: 90 | Fill #3

## 2017-10-28 MED FILL — ESTRADIOL 0.1 MG/GM CREA: 0.1 | 30 days supply | Qty: 43 | Fill #2

## 2018-01-21 MED FILL — OLMESARTAN-HCTZ 20-12.5 MG: 20-12.5 | 60 days supply | Qty: 60 | Fill #4

## 2018-04-28 MED FILL — DOXYCYCLINE HYCLATE 100 MG: 100 | 14 days supply | Qty: 28 | Fill #0

## 2020-11-15 ENCOUNTER — Encounter: Payer: Self-pay | Admitting: Gastroenterology
